# Patient Record
Sex: Male | Born: 1990
Health system: Southern US, Community
[De-identification: ages and names within clinical notes are randomized; demographics above are authoritative.]

## PROBLEM LIST (undated history)

## (undated) DIAGNOSIS — J45909 Unspecified asthma, uncomplicated: Secondary | ICD-10-CM

## (undated) DIAGNOSIS — E785 Hyperlipidemia, unspecified: Secondary | ICD-10-CM

## (undated) DIAGNOSIS — K219 Gastro-esophageal reflux disease without esophagitis: Secondary | ICD-10-CM

## (undated) DIAGNOSIS — F419 Anxiety disorder, unspecified: Secondary | ICD-10-CM

## (undated) HISTORY — DX: Hyperlipidemia, unspecified: E78.5

## (undated) HISTORY — DX: Unspecified asthma, uncomplicated: J45.909

## (undated) HISTORY — PX: INGUINAL HERNIA REPAIR: SUR1180

## (undated) HISTORY — DX: Gastro-esophageal reflux disease without esophagitis: K21.9

## (undated) HISTORY — DX: Anxiety disorder, unspecified: F41.9

---

## 2017-03-17 ENCOUNTER — Ambulatory Visit (HOSPITAL_COMMUNITY)
Admission: EM | Admit: 2017-03-17 | Discharge: 2017-03-17 | Disposition: A | Discharge status: Home / Self Care | Payer: 59 | Attending: Emergency Medicine | Admitting: Emergency Medicine

## 2017-03-17 DIAGNOSIS — H6123 Impacted cerumen, bilateral: Secondary | ICD-10-CM | POA: Diagnosis not present

## 2017-03-17 NOTE — ED Provider Notes (Signed)
  [place]   [id number] 03/17/17 Arrival Time: 4920  ASSESSMENT & PLAN:  1. Bilateral impacted cerumen     No orders of the defined types were placed in this encounter.   Reviewed expectations re: course of current medical issues. Questions answered. Outlined signs and symptoms indicating need for more acute intervention. Patient verbalized understanding. After Visit Summary given.   SUBJECTIVE:  Javonni Macke is a 26 y.o. male who presents with complaint of bilateral cerumen impactions  ROS: As per HPI.   OBJECTIVE:  Vitals:   03/17/17 1202 03/17/17 1203  BP: 140/88   Pulse: (!) 109   Resp: 16   Temp: 98.9 F (37.2 C)   TempSrc: Oral   SpO2: 100%   Weight:  185 lb (83.9 kg)  Height:  6\' 1"  (1.854 m)     General appearance: alert; no distress Eyes: PERRLA; EOMI; conjunctiva normal HENT: normocephalic; atraumatic; TMs occluded with curemen and after irrigation are normal; nasal mucosa normal; oral mucosa normal Neck: supple Lungs: clear to auscultation bilaterally Heart: regular rate and rhythm  No past medical history on file.   has no past medical history on file.  No results found for this or any previous visit.  Labs Reviewed - No data to display  Imaging: No results found.  Allergies not on file  No family history on file. No past surgical history on file.       Lysbeth Penner, [place] 03/17/17 1615

## 2017-03-25 DIAGNOSIS — Z1283 Encounter for screening for malignant neoplasm of skin: Secondary | ICD-10-CM | POA: Diagnosis not present

## 2017-03-25 DIAGNOSIS — L218 Other seborrheic dermatitis: Secondary | ICD-10-CM | POA: Diagnosis not present

## 2017-03-25 DIAGNOSIS — D225 Melanocytic nevi of trunk: Secondary | ICD-10-CM | POA: Diagnosis not present

## 2018-01-05 ENCOUNTER — Encounter: Payer: Self-pay | Admitting: Family Medicine

## 2018-01-05 ENCOUNTER — Ambulatory Visit (INDEPENDENT_AMBULATORY_CARE_PROVIDER_SITE_OTHER): Payer: 59 | Admitting: Family Medicine

## 2018-01-05 VITALS — BP 120/78 | HR 95 | Temp 98.3°F | Ht 73.0 in | Wt 184.0 lb

## 2018-01-05 DIAGNOSIS — Z0000 Encounter for general adult medical examination without abnormal findings: Secondary | ICD-10-CM | POA: Diagnosis not present

## 2018-01-05 DIAGNOSIS — Z13220 Encounter for screening for lipoid disorders: Secondary | ICD-10-CM

## 2018-01-05 HISTORY — DX: Encounter for general adult medical examination without abnormal findings: Z00.00

## 2018-01-05 LAB — CBC
HCT: 42.6 % (ref 39.0–52.0)
HEMOGLOBIN: 15 g/dL (ref 13.0–17.0)
MCHC: 35.3 g/dL (ref 30.0–36.0)
MCV: 93.4 fl (ref 78.0–100.0)
PLATELETS: 209 10*3/uL (ref 150.0–400.0)
RBC: 4.57 Mil/uL (ref 4.22–5.81)
RDW: 13.2 % (ref 11.5–15.5)
WBC: 6.9 10*3/uL (ref 4.0–10.5)

## 2018-01-05 LAB — BASIC METABOLIC PANEL
BUN: 13 mg/dL (ref 6–23)
CHLORIDE: 102 meq/L (ref 96–112)
CO2: 28 mEq/L (ref 19–32)
Calcium: 9.9 mg/dL (ref 8.4–10.5)
Creatinine, Ser: 1.03 mg/dL (ref 0.40–1.50)
GFR: 92.79 mL/min (ref >60.00)
GLUCOSE: 86 mg/dL (ref 70–99)
Potassium: 3.8 mEq/L (ref 3.5–5.1)
Sodium: 139 mEq/L (ref 135–145)

## 2018-01-05 LAB — LIPID PANEL
CHOL/HDL RATIO: 4
Cholesterol: 197 mg/dL (ref 0–200)
HDL: 46.7 mg/dL (ref >39.00)
LDL Cholesterol: 136 mg/dL — ABNORMAL HIGH (ref 0–99)
NONHDL: 150.52
Triglycerides: 75 mg/dL (ref 0.0–149.0)
VLDL: 15 mg/dL (ref 0.0–40.0)

## 2018-01-05 LAB — TSH: TSH: 2.29 u[IU]/mL (ref 0.35–4.50)

## 2018-01-05 NOTE — Patient Instructions (Signed)

## 2018-01-05 NOTE — Assessment & Plan Note (Addendum)
Well adult Orders Placed This Encounter  Procedures  . CBC  . Basic Metabolic Panel (BMET)  . TSH  . Lipid Profile  Immunizations: Up to date Screenings: Lipid panel ordered Anticipatory guidance/Risk Factor Reduction:  See AVS

## 2018-01-05 NOTE — Progress Notes (Signed)
David Best - 27 y.o. male MRN [id number]  Date of birth: [birth year]  Subjective Chief Complaint  Patient presents with  . Establish Care    est care/CPE--fasting    HPI David Best is a 27 y.o. male here today to establish care and for annual wellness exam.  He has history of previous inguinal hernia repair but is otherwise been healthy.  He denies any concerns at this time.  He has a follow fairly healthy diet and is fairly active getting at least 20 to 30 minutes of walking every day.  He is up-to-date on immunizations.  Review of Systems  Constitutional: Negative for chills, fever, malaise/fatigue and weight loss.  HENT: Negative for congestion, ear pain and sore throat.   Eyes: Negative for blurred vision, double vision and pain.  Respiratory: Negative for cough and shortness of breath.   Cardiovascular: Negative for chest pain and palpitations.  Gastrointestinal: Negative for abdominal pain, blood in stool, constipation, heartburn and nausea.  Genitourinary: Negative for dysuria and urgency.  Musculoskeletal: Negative for joint pain and myalgias.  Neurological: Negative for dizziness and headaches.  Endo/Heme/Allergies: Does not bruise/bleed easily.  Psychiatric/Behavioral: Negative for depression. The patient is not nervous/anxious and does not have insomnia.     No Known Allergies  Past Medical History:  Diagnosis Date  . Asthma    as a child    Past Surgical History:  Procedure Laterality Date  . INGUINAL HERNIA REPAIR Left     Social History   Socioeconomic History  . Marital status: Married    Spouse name: Not on file  . Number of children: Not on file  . Years of education: Not on file  . Highest education level: Not on file  Occupational History  . Not on file  Social Needs  . Financial resource strain: Not on file  . Food insecurity:    Worry: Not on file    Inability: Not on file  . Transportation needs:    Medical: Not on file     Non-medical: Not on file  Tobacco Use  . Smoking status: Never Smoker  . Smokeless tobacco: Never Used  Substance and Sexual Activity  . Alcohol use: Yes    Comment: 1-2 beer weekly  . Drug use: Never  . Sexual activity: Not on file  Lifestyle  . Physical activity:    Days per week: Not on file    Minutes per session: Not on file  . Stress: Not on file  Relationships  . Social connections:    Talks on phone: Not on file    Gets together: Not on file    Attends religious service: Not on file    Active member of club or organization: Not on file    Attends meetings of clubs or organizations: Not on file    Relationship status: Not on file  Other Topics Concern  . Not on file  Social History Narrative  . Not on file    Family History  Problem Relation Age of Onset  . Hypertension Father   . Diabetes Paternal Grandmother   . Transient ischemic attack Paternal Grandfather     Health Maintenance  Topic Date Due  . HIV Screening  01/06/2019 (Originally 01/13/2007)  . INFLUENZA VACCINE  02/23/2018  . TETANUS/TDAP  09/24/2027    ----------------------------------------------------------------------------------------------------------------------------------------------------------------------------------------------------------------- Physical Exam BP 120/78   Pulse 95   Temp 98.3 F (36.8 C) (Oral)   Ht 6\' 1"  (1.854 m)  Wt 184 lb (83.5 kg)   SpO2 99%   BMI 24.28 kg/m   Physical Exam  Constitutional: He is oriented to person, place, and time. He appears well-nourished. No distress.  HENT:  Head: Normocephalic and atraumatic.  Mouth/Throat: Oropharynx is clear and moist.  Normal TM bilaterally.   Eyes: No scleral icterus.  Neck: Neck supple. No thyromegaly present.  Cardiovascular: Normal rate, regular rhythm and normal heart sounds.  Pulmonary/Chest: Effort normal and breath sounds normal.  Abdominal: Soft. Bowel sounds are normal. He exhibits no distension.  There is no tenderness.  Musculoskeletal: Normal range of motion. He exhibits no edema.  Lymphadenopathy:    He has no cervical adenopathy.  Neurological: He is alert and oriented to person, place, and time.  Skin: Skin is warm and dry. No rash noted.  Psychiatric: He has a normal mood and affect. His behavior is normal.    ------------------------------------------------------------------------------------------------------------------------------------------------------------------------------------------------------------------- Assessment and Plan  Well adult exam Well adult Orders Placed This Encounter  Procedures  . CBC  . Basic Metabolic Panel (BMET)  . TSH  . Lipid Profile  Immunizations: Up to date Screenings: Lipid panel ordered Anticipatory guidance/Risk Factor Reduction:  See AVS

## 2019-02-27 ENCOUNTER — Other Ambulatory Visit: Payer: Self-pay

## 2019-02-28 ENCOUNTER — Ambulatory Visit (INDEPENDENT_AMBULATORY_CARE_PROVIDER_SITE_OTHER): Payer: No Typology Code available for payment source | Admitting: Family Medicine

## 2019-02-28 ENCOUNTER — Encounter: Payer: Self-pay | Admitting: Family Medicine

## 2019-02-28 VITALS — BP 128/82 | HR 75 | Temp 98.3°F | Ht 73.0 in | Wt 191.6 lb

## 2019-02-28 DIAGNOSIS — Z13220 Encounter for screening for lipoid disorders: Secondary | ICD-10-CM | POA: Diagnosis not present

## 2019-02-28 DIAGNOSIS — Z0000 Encounter for general adult medical examination without abnormal findings: Secondary | ICD-10-CM

## 2019-02-28 LAB — LIPID PANEL
Cholesterol: 196 mg/dL (ref 0–200)
HDL: 46.1 mg/dL (ref >39.00)
LDL Cholesterol: 134 mg/dL — ABNORMAL HIGH (ref 0–99)
NonHDL: 149.45
Total CHOL/HDL Ratio: 4
Triglycerides: 78 mg/dL (ref 0.0–149.0)
VLDL: 15.6 mg/dL (ref 0.0–40.0)

## 2019-02-28 LAB — CBC WITH DIFFERENTIAL/PLATELET
Basophils Absolute: 0 10*3/uL (ref 0.0–0.1)
Basophils Relative: 0.5 % (ref 0.0–3.0)
Eosinophils Absolute: 0 10*3/uL (ref 0.0–0.7)
Eosinophils Relative: 0.8 % (ref 0.0–5.0)
HCT: 44.7 % (ref 39.0–52.0)
Hemoglobin: 15.5 g/dL (ref 13.0–17.0)
Lymphocytes Relative: 32.6 % (ref 12.0–46.0)
Lymphs Abs: 1.8 10*3/uL (ref 0.7–4.0)
MCHC: 34.7 g/dL (ref 30.0–36.0)
MCV: 93.5 fl (ref 78.0–100.0)
Monocytes Absolute: 0.5 10*3/uL (ref 0.1–1.0)
Monocytes Relative: 8.8 % (ref 3.0–12.0)
Neutro Abs: 3.2 10*3/uL (ref 1.4–7.7)
Neutrophils Relative %: 57.3 % (ref 43.0–77.0)
Platelets: 216 10*3/uL (ref 150.0–400.0)
RBC: 4.78 Mil/uL (ref 4.22–5.81)
RDW: 12.9 % (ref 11.5–15.5)
WBC: 5.6 10*3/uL (ref 4.0–10.5)

## 2019-02-28 LAB — COMPREHENSIVE METABOLIC PANEL
ALT: 26 U/L (ref 0–53)
AST: 23 U/L (ref 0–37)
Albumin: 5 g/dL (ref 3.5–5.2)
Alkaline Phosphatase: 39 U/L (ref 39–117)
BUN: 10 mg/dL (ref 6–23)
CO2: 27 mEq/L (ref 19–32)
Calcium: 9.7 mg/dL (ref 8.4–10.5)
Chloride: 102 mEq/L (ref 96–112)
Creatinine, Ser: 0.92 mg/dL (ref 0.40–1.50)
GFR: 98.59 mL/min (ref >60.00)
Glucose, Bld: 93 mg/dL (ref 70–99)
Potassium: 4.1 mEq/L (ref 3.5–5.1)
Sodium: 137 mEq/L (ref 135–145)
Total Bilirubin: 0.7 mg/dL (ref 0.2–1.2)
Total Protein: 7.6 g/dL (ref 6.0–8.3)

## 2019-02-28 LAB — TSH: TSH: 2.1 u[IU]/mL (ref 0.35–4.50)

## 2019-02-28 NOTE — Assessment & Plan Note (Signed)
Well adult Orders Placed This Encounter  Procedures  . Comp Met (CMET)  . CBC w/Diff  . TSH  . Lipid panel  Immunizations:  UTD Screenings:  Lipid panel Anticipatory guidance/Risk factor reduction: encouraged to continue healthy habits, follow diet rich in fruits and vegetables with regular exercise.  Additional recommendations per AVS.

## 2019-02-28 NOTE — Progress Notes (Signed)
David Best - 28 y.o. male MRN [id number]  Date of birth: [birth year]  Subjective Chief Complaint  Patient presents with  . Annual Exam    CPE- fasting    HPI David Best is a 28 y.o. male here today for annual exam.  He denies any changes to his medical history since last year.  He continues to follow a fairly healthy diet.  He remains active.  He is a lifelong nonsmoker.  He consumes 1-2 beers weekly.    Review of Systems  Constitutional: Negative for chills, fever, malaise/fatigue and weight loss.  HENT: Negative for congestion, ear pain and sore throat.   Eyes: Negative for blurred vision, double vision and pain.  Respiratory: Negative for cough and shortness of breath.   Cardiovascular: Negative for chest pain and palpitations.  Gastrointestinal: Negative for abdominal pain, blood in stool, constipation, heartburn and nausea.  Genitourinary: Negative for dysuria and urgency.  Musculoskeletal: Negative for joint pain and myalgias.  Neurological: Negative for dizziness and headaches.  Endo/Heme/Allergies: Does not bruise/bleed easily.  Psychiatric/Behavioral: Negative for depression. The patient is not nervous/anxious and does not have insomnia.     No Known Allergies  Past Medical History:  Diagnosis Date  . Asthma    as a child    Past Surgical History:  Procedure Laterality Date  . INGUINAL HERNIA REPAIR Left     Social History   Socioeconomic History  . Marital status: Married    Spouse name: Not on file  . Number of children: Not on file  . Years of education: Not on file  . Highest education level: Not on file  Occupational History  . Not on file  Social Needs  . Financial resource strain: Not on file  . Food insecurity    Worry: Not on file    Inability: Not on file  . Transportation needs    Medical: Not on file    Non-medical: Not on file  Tobacco Use  . Smoking status: Never Smoker  . Smokeless tobacco: Never Used  Substance  and Sexual Activity  . Alcohol use: Yes    Comment: 1-2 beer weekly  . Drug use: Never  . Sexual activity: Not on file  Lifestyle  . Physical activity    Days per week: Not on file    Minutes per session: Not on file  . Stress: Not on file  Relationships  . Social Herbalist on phone: Not on file    Gets together: Not on file    Attends religious service: Not on file    Active member of club or organization: Not on file    Attends meetings of clubs or organizations: Not on file    Relationship status: Not on file  Other Topics Concern  . Not on file  Social History Narrative  . Not on file    Family History  Problem Relation Age of Onset  . Hypertension Father   . Diabetes Paternal Grandmother   . Transient ischemic attack Paternal Grandfather     Health Maintenance  Topic Date Due  . HIV Screening  01/13/2007  . INFLUENZA VACCINE  02/24/2019  . TETANUS/TDAP  09/24/2027    ----------------------------------------------------------------------------------------------------------------------------------------------------------------------------------------------------------------- Physical Exam BP 128/82   Pulse 75   Temp 98.3 F (36.8 C) (Oral)   Ht 6' 1"  (1.854 m)   Wt 191 lb 9.6 oz (86.9 kg)   SpO2 99%   BMI 25.28 kg/m   Physical  Exam Constitutional:      General: He is not in acute distress. HENT:     Head: Normocephalic and atraumatic.     Right Ear: External ear normal.     Left Ear: External ear normal.  Eyes:     General: No scleral icterus. Neck:     Musculoskeletal: Normal range of motion.     Thyroid: No thyromegaly.  Cardiovascular:     Rate and Rhythm: Normal rate and regular rhythm.     Heart sounds: Normal heart sounds.  Pulmonary:     Effort: Pulmonary effort is normal.     Breath sounds: Normal breath sounds.  Abdominal:     General: Bowel sounds are normal. There is no distension.     Palpations: Abdomen is soft.      Tenderness: There is no abdominal tenderness. There is no guarding.  Lymphadenopathy:     Cervical: No cervical adenopathy.  Skin:    General: Skin is warm and dry.     Findings: No rash.  Neurological:     Mental Status: He is alert and oriented to person, place, and time.     Cranial Nerves: No cranial nerve deficit.     Motor: No abnormal muscle tone.  Psychiatric:        Behavior: Behavior normal.     ------------------------------------------------------------------------------------------------------------------------------------------------------------------------------------------------------------------- Assessment and Plan  Well adult exam Well adult Orders Placed This Encounter  Procedures  . Comp Met (CMET)  . CBC w/Diff  . TSH  . Lipid panel  Immunizations:  UTD Screenings:  Lipid panel Anticipatory guidance/Risk factor reduction: encouraged to continue healthy habits, follow diet rich in fruits and vegetables with regular exercise.  Additional recommendations per AVS.

## 2019-02-28 NOTE — Patient Instructions (Signed)
Preventive Care 19-27 Years Old, Male Preventive care refers to lifestyle choices and visits with your health care provider that can promote health and wellness. This includes:  A yearly physical exam. This is also called an annual well check.  Regular dental and eye exams.  Immunizations.  Screening for certain conditions.  Healthy lifestyle choices, such as eating a healthy diet, getting regular exercise, not using drugs or products that contain nicotine and tobacco, and limiting alcohol use. What can I expect for my preventive care visit? Physical exam Your health care provider will check:  Height and weight. These may be used to calculate body mass index (BMI), which is a measurement that tells if you are at a healthy weight.  Heart rate and blood pressure.  Your skin for abnormal spots. Counseling Your health care provider may ask you questions about:  Alcohol, tobacco, and drug use.  Emotional well-being.  Home and relationship well-being.  Sexual activity.  Eating habits.  Work and work Statistician. What immunizations do I need?  Influenza (flu) vaccine  This is recommended every year. Tetanus, diphtheria, and pertussis (Tdap) vaccine  You may need a Td booster every 10 years. Varicella (chickenpox) vaccine  You may need this vaccine if you have not already been vaccinated. Human papillomavirus (HPV) vaccine  If recommended by your health care provider, you may need three doses over 6 months. Measles, mumps, and rubella (MMR) vaccine  You may need at least one dose of MMR. You may also need a second dose. Meningococcal conjugate (MenACWY) vaccine  One dose is recommended if you are 45-76 years old and a Market researcher living in a residence hall, or if you have one of several medical conditions. You may also need additional booster doses. Pneumococcal conjugate (PCV13) vaccine  You may need this if you have certain conditions and were not  previously vaccinated. Pneumococcal polysaccharide (PPSV23) vaccine  You may need one or two doses if you smoke cigarettes or if you have certain conditions. Hepatitis A vaccine  You may need this if you have certain conditions or if you travel or work in places where you may be exposed to hepatitis A. Hepatitis B vaccine  You may need this if you have certain conditions or if you travel or work in places where you may be exposed to hepatitis B. Haemophilus influenzae type b (Hib) vaccine  You may need this if you have certain risk factors. You may receive vaccines as individual doses or as more than one vaccine together in one shot (combination vaccines). Talk with your health care provider about the risks and benefits of combination vaccines. What tests do I need? Blood tests  Lipid and cholesterol levels. These may be checked every 5 years starting at age 17.  Hepatitis C test.  Hepatitis B test. Screening   Diabetes screening. This is done by checking your blood sugar (glucose) after you have not eaten for a while (fasting).  Sexually transmitted disease (STD) testing. Talk with your health care provider about your test results, treatment options, and if necessary, the need for more tests. Follow these instructions at home: Eating and drinking   Eat a diet that includes fresh fruits and vegetables, whole grains, lean protein, and low-fat dairy products.  Take vitamin and mineral supplements as recommended by your health care provider.  Do not drink alcohol if your health care provider tells you not to drink.  If you drink alcohol: ? Limit how much you have to 0-2  drinks a day. ? Be aware of how much alcohol is in your drink. In the [place] one drink equals one 12 oz bottle of beer (355 mL), one 5 oz glass of wine (148 mL), or one 1 oz glass of hard liquor (44 mL). Lifestyle  Take daily care of your teeth and gums.  Stay active. Exercise for at least 30 minutes on 5 or  more days each week.  Do not use any products that contain nicotine or tobacco, such as cigarettes, e-cigarettes, and chewing tobacco. If you need help quitting, ask your health care provider.  If you are sexually active, practice safe sex. Use a condom or other form of protection to prevent STIs (sexually transmitted infections). What's next?  Go to your health care provider once a year for a well check visit.  Ask your health care provider how often you should have your eyes and teeth checked.  Stay up to date on all vaccines. This information is not intended to replace advice given to you by your health care provider. Make sure you discuss any questions you have with your health care provider. Document Released: 09/07/2001 Document Revised: 07/06/2018 Document Reviewed: 07/06/2018 Elsevier Patient Education  2020 Elsevier Inc.  

## 2019-05-30 ENCOUNTER — Encounter: Payer: Self-pay | Admitting: Physician Assistant

## 2019-05-30 ENCOUNTER — Ambulatory Visit
Admission: EM | Admit: 2019-05-30 | Discharge: 2019-05-30 | Disposition: A | Discharge status: Home / Self Care | Payer: No Typology Code available for payment source

## 2019-05-30 DIAGNOSIS — H938X1 Other specified disorders of right ear: Secondary | ICD-10-CM

## 2019-05-30 NOTE — ED Provider Notes (Signed)
[facility]    CSN: [id number] Arrival date & time: 05/30/19  1230      History   Chief Complaint Chief Complaint  Patient presents with  . Otalgia    HPI David Best is a 28 y.o. male.   28 year old male comes in for 2 day history of right ear irritation. Patient had muffled hearing 2 days ago, had RN at work check ear and was found to be impacted with cerumen. After ear irritation, muffled hearing resolved, but has since then had ear irritation, aching, and feelings of fluid in the ear. Denies ear drainage. Denies fever, chills, body aches. Denies URI symptoms such as cough, congestion, sore throat. Has not tried anything else for the symptoms.      Past Medical History:  Diagnosis Date  . Asthma    as a child    Patient Active Problem List   Diagnosis Date Noted  . Well adult exam 01/05/2018    Past Surgical History:  Procedure Laterality Date  . INGUINAL HERNIA REPAIR Left        Home Medications    Prior to Admission medications   Not on File    Family History Family History  Problem Relation Age of Onset  . Hypertension Father   . Diabetes Paternal Grandmother   . Transient ischemic attack Paternal Grandfather     Social History Social History   Tobacco Use  . Smoking status: Never Smoker  . Smokeless tobacco: Never Used  Substance Use Topics  . Alcohol use: Yes    Comment: 1-2 beer weekly  . Drug use: Never     Allergies   Patient has no known allergies.   Review of Systems Review of Systems  Reason unable to perform ROS: See HPI as above.     Physical Exam Triage Vital Signs ED Triage Vitals [05/30/19 1240]  Enc Vitals Group     BP 129/86     Pulse Rate 68     Resp 16     Temp 97.9 F (36.6 C)     Temp Source Oral     SpO2 98 %     Weight      Height      Head Circumference      Peak Flow      Pain Score      Pain Loc      Pain Edu?      Excl. in [place]?    No data found.  Updated Vital  Signs BP 129/86 (BP Location: Right Arm)   Pulse 68   Temp 97.9 F (36.6 C) (Oral)   Resp 16   SpO2 98%   Physical Exam Constitutional:      General: He is not in acute distress.    Appearance: He is well-developed. He is not diaphoretic.  HENT:     Head: Normocephalic and atraumatic.     Right Ear: Tympanic membrane, ear canal and external ear normal. Tympanic membrane is not erythematous or bulging.     Left Ear: Ear canal and external ear normal. A middle ear effusion is present. Tympanic membrane is not erythematous or bulging.     Ears:     Comments: No tenderness to palpation of bilateral tragus.  Eyes:     Conjunctiva/sclera: Conjunctivae normal.     Pupils: Pupils are equal, round, and reactive to light.  Pulmonary:     Effort: Pulmonary effort is normal. No respiratory distress.  Neurological:     Mental Status: He is alert and oriented to person, place, and time.      UC Treatments / Results  Labs (all labs ordered are listed, but only abnormal results are displayed) Labs Reviewed - No data to display  EKG   Radiology No results found.  Procedures Procedures (including critical care time)  Medications Ordered in UC Medications - No data to display  Initial Impression / Assessment and Plan / UC Course  I have reviewed the triage vital signs and the nursing notes.  Pertinent labs & imaging results that were available during my care of the patient were reviewed by me and considered in my medical decision making (see chart for details).    Normal exam to the right ear. Left ear with mid ear effusion, ?eustachian tube dysfunction. No signs of otitis media/externa. Discussed using flonase and continue to monitor symptoms. Return precautions given. Patient expresses understanding and agrees to plan.  Final Clinical Impressions(s) / UC Diagnoses   Final diagnoses:  Irritation of right ear   ED Prescriptions    None     PDMP not reviewed this encounter.    Ok Edwards, PA-C 05/30/19 1443

## 2019-05-30 NOTE — ED Triage Notes (Signed)
Pt c/o rt ear pain x3 days, flushed x1 day with relief, still feels like residual fluid in ear

## 2019-05-30 NOTE — Discharge Instructions (Signed)
Normal exam. Start flonase for possible eustachian tube dysfunction. Recheck if symptoms not improving.

## 2019-06-05 ENCOUNTER — Other Ambulatory Visit: Payer: Self-pay | Admitting: Family Medicine

## 2019-06-05 ENCOUNTER — Encounter: Payer: Self-pay | Admitting: Family Medicine

## 2019-06-05 ENCOUNTER — Telehealth: Payer: Self-pay | Admitting: *Deleted

## 2019-06-05 ENCOUNTER — Other Ambulatory Visit: Payer: Self-pay

## 2019-06-05 ENCOUNTER — Ambulatory Visit (INDEPENDENT_AMBULATORY_CARE_PROVIDER_SITE_OTHER): Payer: No Typology Code available for payment source | Admitting: Family Medicine

## 2019-06-05 ENCOUNTER — Telehealth: Payer: Self-pay

## 2019-06-05 ENCOUNTER — Ambulatory Visit: Payer: Self-pay | Admitting: *Deleted

## 2019-06-05 ENCOUNTER — Ambulatory Visit (INDEPENDENT_AMBULATORY_CARE_PROVIDER_SITE_OTHER): Payer: No Typology Code available for payment source

## 2019-06-05 VITALS — BP 130/90 | HR 69 | Temp 98.6°F | Ht 73.0 in | Wt 187.7 lb

## 2019-06-05 DIAGNOSIS — R0789 Other chest pain: Secondary | ICD-10-CM | POA: Insufficient documentation

## 2019-06-05 DIAGNOSIS — R9389 Abnormal findings on diagnostic imaging of other specified body structures: Secondary | ICD-10-CM

## 2019-06-05 LAB — D-DIMER, QUANTITATIVE (NOT AT ARMC): D-Dimer, Quant: <0.19 mcg/mL FEU (ref <0.50)

## 2019-06-05 NOTE — Telephone Encounter (Signed)
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 Call to office for appointment.  Reason for Disposition . 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 Call to office for appointment  Answer Assessment - Initial Assessment Questions 1. LOCATION: "Where does it hurt?"       Tightness- in middle to off left then at other times feels all over. 2. RADIATION: "Does the pain go anywhere else?" (e.g., into neck, jaw, arms, back)     no 3. ONSET: "When did the chest pain begin?" (Minutes, hours or days)      On/off over the weekend 4. PATTERN "Does the pain come and go, or has it been constant since it started?"  "Does it get worse with exertion?"      consistent other times it is more pronounced 5. DURATION: "How long does it last" (e.g., seconds, minutes, hours)     Tightness- not pressure or pain 6. SEVERITY: "How bad is the pain?"  (e.g., Scale 1-10; mild, moderate, or severe)    - MILD (1-3): doesn't interfere with normal activities     - MODERATE (4-7): interferes with normal activities or awakens from sleep    - SEVERE (8-10): excruciating pain, unable to do any normal activities       mild 7. CARDIAC RISK FACTORS: "Do you have any history of heart problems or risk factors for heart disease?" (e.g., angina, prior heart attack; diabetes, high blood pressure, high cholesterol, smoker, or strong family history of heart disease)     No- family history Afib 8. PULMONARY RISK FACTORS: "Do you have any history of lung disease?"  (e.g., blood clots in lung, asthma, emphysema, birth control pills)     childhood asthma only 9. CAUSE: "What do you think is causing the chest pain?"     Patient thinks it more a breathing issue 10. OTHER SYMPTOMS: "Do you have any other symptoms?"  (e.g., dizziness, nausea, vomiting, sweating, fever, difficulty breathing, cough)       Off/on cough- allergy related 11. PREGNANCY: "Is there any chance you are pregnant?" "When was your last menstrual period?"       n/a  Protocols used: CHEST PAIN-A-AH

## 2019-06-05 NOTE — Telephone Encounter (Signed)
Pt returned call and message given to him regarding the results of D-dimer and CXR. He would like to have the chest xray repeated. And would like someone to give him a call back regarding when and where he should go for the xray.  He is feeling a little better regarding his chest pain.

## 2019-06-05 NOTE — Telephone Encounter (Signed)
appt sch 06/06/19 @ 1:00pm.

## 2019-06-05 NOTE — Patient Instructions (Signed)
We'll be in touch with results from D-Dimer and chest xray.  If both are negative I think we can try conservative treatment with anti-inflammatory and alternating heat/ice to area.

## 2019-06-05 NOTE — Telephone Encounter (Signed)
Questions for Screening COVID-19    Travel or Contacts: no  During this illness, did/does the patient experience any of the following symptoms? Fever >100.66F []   Yes [x]   No []   Unknown Subjective fever (felt feverish) []   Yes [x]   No []   Unknown Chills []   Yes [x]   No []   Unknown Muscle aches (myalgia) []   Yes [x]   No []   Unknown Runny nose (rhinorrhea) []   Yes [x]   No []   Unknown Sore throat []   Yes [x]   No []   Unknown Cough (new onset or worsening of chronic cough) []   Yes [x]   No []   Unknown Shortness of breath (dyspnea) []   Yes [x]   No []   Unknown Nausea or vomiting []   Yes [x]   No []   Unknown Headache []   Yes [x]   No []   Unknown Abdominal pain  []   Yes [x]   No []   Unknown Diarrhea (?3 loose/looser than normal stools/24hr period) []   Yes [x]   No []   Unknown

## 2019-06-05 NOTE — Telephone Encounter (Signed)
Orders entered for repeat cxr.  I would recommend that he have this completed here and I'll talk to Levada Dy to be sure we get the view that radiology requested.  Please schedule at a time that is convenient for him.

## 2019-06-05 NOTE — Assessment & Plan Note (Addendum)
-  Had EKG completed already and normal.  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 -CXR completed today.  By my read there is no acute process noted.  -Check stat D-dimer

## 2019-06-05 NOTE — Progress Notes (Signed)
David Best - 28 y.o. male MRN [id number]  Date of birth: May 27, 1991  Subjective Chief Complaint  Patient presents with  . Chest Pain    chest tightness on and off over the weekend worse today.    HPI David Best is a 28 y.o. male here today with complaint of chest pain/tightness.  He reports that he has had this off and on over the weekend with another episode this morning.  Chest pain is just left of sternum with some radiation into back.  He denies radiation into jaw or arm.  Pain is not worsened with exertion.  He doesn't really feel shortness of breath.  He denies nausea, vomiting or heartburn.  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 He is concerned because he has had a couple of friends who had asymptomatic COVID infections and had similar symptoms that turned out to be a PE.    ROS:  A comprehensive ROS was completed and negative except as noted per HPI  No Known Allergies  Past Medical History:  Diagnosis Date  . Asthma    as a child    Past Surgical History:  Procedure Laterality Date  . INGUINAL HERNIA REPAIR Left     Social History   Socioeconomic History  . Marital status: Married    Spouse name: Not on file  . Number of children: Not on file  . Years of education: Not on file  . Highest education level: Not on file  Occupational History  . Not on file  Social Needs  . Financial resource strain: Not on file  . Food insecurity    Worry: Not on file    Inability: Not on file  . Transportation needs    Medical: Not on file    Non-medical: Not on file  Tobacco Use  . Smoking status: Never Smoker  . Smokeless tobacco: Never Used  Substance and Sexual Activity  . Alcohol use: Yes    Comment: 1-2 beer weekly  . Drug use: Never  . Sexual activity: Not on file  Lifestyle  . Physical activity    Days per week: Not on file    Minutes per session: Not on  file  . Stress: Not on file  Relationships  . Social Herbalist on phone: Not on file    Gets together: Not on file    Attends religious service: Not on file    Active member of club or organization: Not on file    Attends meetings of clubs or organizations: Not on file    Relationship status: Not on file  Other Topics Concern  . Not on file  Social History Narrative  . Not on file    Family History  Problem Relation Age of Onset  . Hypertension Father   . Diabetes Paternal Grandmother   . Transient ischemic attack Paternal Grandfather     Health Maintenance  Topic Date Due  . HIV Screening  01/13/2007  . TETANUS/TDAP  09/24/2027  . INFLUENZA VACCINE  Completed    ----------------------------------------------------------------------------------------------------------------------------------------------------------------------------------------------------------------- Physical Exam BP 130/90   Pulse 69   Temp 98.6 F (37 C) (Temporal)   Ht 6\' 1"  (1.854 m)   Wt 187 lb 11.2 oz (85.1 kg)   SpO2 99%   BMI 24.76 kg/m   Physical Exam Constitutional:      Appearance: He is well-developed.  HENT:     Head: Normocephalic and atraumatic.     Mouth/Throat:     Mouth: Mucous membranes are moist.  Eyes:     General: No scleral icterus. Cardiovascular:     Rate and Rhythm: Normal rate and regular rhythm.     Heart sounds: Normal heart sounds. No murmur. No friction rub.  Pulmonary:     Effort: Pulmonary effort is normal. No respiratory distress.     Breath sounds: Normal breath sounds. No wheezing.  Chest:     Chest wall: No tenderness.  Musculoskeletal:     Right lower leg: No edema.     Left lower leg: No edema.  Skin:    General: Skin is warm and dry.     Findings: No rash.  Neurological:     Mental Status: He is alert.  Psychiatric:        Mood and Affect: Mood normal.        Behavior: Behavior normal.      ------------------------------------------------------------------------------------------------------------------------------------------------------------------------------------------------------------------- Assessment and Plan  Chest tightness -Had EKG completed already and normal.  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 -CXR completed today.  By my read there is no acute process noted.  -Check stat D-dimer

## 2019-06-06 ENCOUNTER — Ambulatory Visit (INDEPENDENT_AMBULATORY_CARE_PROVIDER_SITE_OTHER): Payer: No Typology Code available for payment source

## 2019-06-06 ENCOUNTER — Other Ambulatory Visit: Payer: Self-pay

## 2019-06-06 ENCOUNTER — Encounter: Payer: Self-pay | Admitting: Family Medicine

## 2019-06-06 DIAGNOSIS — R9389 Abnormal findings on diagnostic imaging of other specified body structures: Secondary | ICD-10-CM | POA: Diagnosis not present

## 2019-06-06 NOTE — Addendum Note (Signed)
Addended by: Lynnea Ferrier on: 06/06/2019 01:13 PM   Modules accepted: Orders

## 2019-09-06 ENCOUNTER — Telehealth (INDEPENDENT_AMBULATORY_CARE_PROVIDER_SITE_OTHER): Payer: No Typology Code available for payment source | Admitting: Family Medicine

## 2019-09-06 ENCOUNTER — Other Ambulatory Visit: Payer: Self-pay

## 2019-09-06 ENCOUNTER — Encounter: Payer: Self-pay | Admitting: Family Medicine

## 2019-09-06 VITALS — Temp 98.4°F | Ht 73.0 in | Wt 189.8 lb

## 2019-09-06 DIAGNOSIS — Z8709 Personal history of other diseases of the respiratory system: Secondary | ICD-10-CM | POA: Diagnosis not present

## 2019-09-06 DIAGNOSIS — R0789 Other chest pain: Secondary | ICD-10-CM | POA: Diagnosis not present

## 2019-09-06 MED ORDER — FLOVENT HFA 110 MCG/ACT IN AERO: 1.0000 | INHALATION_SPRAY | Freq: Two times a day (BID) | RESPIRATORY_TRACT | 1 refills | Status: DC

## 2019-09-06 MED FILL — FLOVENT HFA 110 MCG INHALER: 110 | 30 days supply | Qty: 12 | Fill #0

## 2019-09-06 NOTE — Progress Notes (Signed)
Virtual Visit via Video Note  I connected with David Best on 09/06/19 at  1:00 PM EST by a video enabled telemedicine application and verified that I am speaking with the correct person using two identifiers. Location patient: home Location provider:  home office Persons participating in the virtual visit: patient, provider  I discussed the limitations of evaluation and management by telemedicine and the availability of in person appointments. The patient expressed understanding and agreed to proceed.  Chief Complaint  Patient presents with  . Chest Pain    Pt c/o feeling pressure and tightness in his chest, on and off since Nov.       HPI: David Best is a 29 y.o. male who was previously a patient of Dr. Zigmund Daniel who complains of intermittent chest tightness/pressure x 3 mo. Tightness located in center of chest or to left of center. He saw PCP in 05/2019 for this, has CXR x 2 done - normal. EKG done at work - normal as per pt. He also endorses intermittent, dry cough.  No CP, palpitations, n/v/d/c. No runny nose, nasal congestion, PND.  Pt states he has good and bad days/weeks.  Pt did not have covid test done.   He started running for exercise and improved diet to help lower cholesterol. Pt is not a smoker. He has a h/o asthma as a child but is not currently on any medication for this.  Past Medical History:  Diagnosis Date  . Asthma    as a child    Past Surgical History:  Procedure Laterality Date  . INGUINAL HERNIA REPAIR Left     Family History  Problem Relation Age of Onset  . Hypertension Father   . Diabetes Paternal Grandmother   . Transient ischemic attack Paternal Grandfather     Social History   Tobacco Use  . Smoking status: Never Smoker  . Smokeless tobacco: Never Used  Substance Use Topics  . Alcohol use: Yes    Comment: 1-2 beer weekly  . Drug use: Never    No current outpatient medications on file.  No Known  Allergies    ROS: See pertinent positives and negatives per HPI.   EXAM:  VITALS per patient if applicable: Temp 99991111 F (36.9 C) (Oral)   Ht 6\' 1"  (1.854 m)   Wt 189 lb 12.8 oz (86.1 kg)   BMI 25.04 kg/m   BP Readings from Last 3 Encounters:  06/05/19 130/90  05/30/19 129/86  02/28/19 128/82   Pulse Readings from Last 3 Encounters:  06/05/19 69  05/30/19 68  02/28/19 75   Wt Readings from Last 3 Encounters:  09/06/19 189 lb 12.8 oz (86.1 kg)  06/05/19 187 lb 11.2 oz (85.1 kg)  02/28/19 191 lb 9.6 oz (86.9 kg)   GENERAL: alert, oriented, appears well and in no acute distress  HEENT: atraumatic, conjunctiva clear, no obvious abnormalities on inspection of external nose and ears  NECK: normal movements of the head and neck  LUNGS: on inspection no signs of respiratory distress, breathing rate appears normal, no obvious gross SOB, gasping or wheezing, no conversational dyspnea  CV: no obvious cyanosis  PSYCH/NEURO: pleasant and cooperative, speech and thought processing grossly intact   ASSESSMENT AND PLAN: 1. Chest tightness 2. H/O intrinsic asthma - normal CXR, EKG, cardiopulmonary exam - symptoms x 3 mo, improved but still present - pt has h/o childhood asthma so could be asthma/reactive airway in nature. Could also have anxiety as contributing factor  Rx: - fluticasone (FLOVENT HFA) 110 MCG/ACT inhaler; Inhale 1 puff into the lungs 2 (two) times daily.  Dispense: 1 Inhaler; Refill: 1 - f/u in 3-4 wks if no or minimal improvement or sooner PRN  Discussed plan and reviewed medications with patient, including risks, benefits, and potential side effects. Pt expressed understand. All questions answered.     I discussed the assessment and treatment plan with the patient. The patient was provided an opportunity to ask questions and all were answered. The patient agreed with the plan and demonstrated an understanding of the instructions.   The patient was advised  to call back or seek an in-person evaluation if the symptoms worsen or if the condition fails to improve as anticipated.   Letta Median, DO

## 2019-09-07 ENCOUNTER — Encounter: Payer: Self-pay | Admitting: Family Medicine

## 2019-10-16 ENCOUNTER — Encounter: Payer: Self-pay | Admitting: Family Medicine

## 2019-10-22 ENCOUNTER — Other Ambulatory Visit: Payer: Self-pay

## 2019-10-22 NOTE — Patient Instructions (Signed)
Health Maintenance Due  Topic Date Due  . HIV Screening  Never done    Depression screen [facility] 2/9 02/28/2019 01/05/2018  Decreased Interest 0 0  Down, Depressed, Hopeless 0 -  PHQ - 2 Score 0 0

## 2019-10-23 ENCOUNTER — Encounter: Payer: Self-pay | Admitting: Family Medicine

## 2019-10-23 ENCOUNTER — Ambulatory Visit (INDEPENDENT_AMBULATORY_CARE_PROVIDER_SITE_OTHER): Payer: No Typology Code available for payment source | Admitting: Family Medicine

## 2019-10-23 VITALS — BP 120/70 | HR 83 | Temp 98.6°F | Ht 73.0 in | Wt 192.2 lb

## 2019-10-23 DIAGNOSIS — R5383 Other fatigue: Secondary | ICD-10-CM | POA: Diagnosis not present

## 2019-10-23 DIAGNOSIS — Z8709 Personal history of other diseases of the respiratory system: Secondary | ICD-10-CM | POA: Diagnosis not present

## 2019-10-23 DIAGNOSIS — R0789 Other chest pain: Secondary | ICD-10-CM

## 2019-10-23 LAB — CBC
HCT: 45 % (ref 39.0–52.0)
Hemoglobin: 15.7 g/dL (ref 13.0–17.0)
MCHC: 34.9 g/dL (ref 30.0–36.0)
MCV: 94 fl (ref 78.0–100.0)
Platelets: 220 10*3/uL (ref 150.0–400.0)
RBC: 4.79 Mil/uL (ref 4.22–5.81)
RDW: 12.9 % (ref 11.5–15.5)
WBC: 7.5 10*3/uL (ref 4.0–10.5)

## 2019-10-23 LAB — VITAMIN D 25 HYDROXY (VIT D DEFICIENCY, FRACTURES): VITD: 22.33 ng/mL — ABNORMAL LOW (ref 30.00–100.00)

## 2019-10-23 LAB — TSH: TSH: 3.44 u[IU]/mL (ref 0.35–4.50)

## 2019-10-23 LAB — VITAMIN B12: Vitamin B-12: 232 pg/mL (ref 211–911)

## 2019-10-23 NOTE — Progress Notes (Signed)
David Best is a 29 y.o. male  Chief Complaint  Patient presents with  . Follow-up    Medication discussion.  Pt c/o feeling fatigue x 46month    HPI: David Best is a 29 y.o. male here for f/u on chest tightness and cough. I saw him virtually on 09/06/19 and started pt on flovent. Since that time, pt reports the cough has significantly improved. He feels the chest tightness has improved.  He reports new onset fatigue, weakness, some lightheadedness which first started in 03/2019 and lasted about 1 mo then resolved. Pt started running again for exercise in 03/2019. Pt stopped running and after a month symptoms improved. Symptoms returned in the past 1 mo.   Pt still coughs in the AM when he wakes up and has to clear his throat. No new or worse stuffy nose, runny nose.   Pt does snore, this is not new or different. No witnessed apneic episodes. Pt feels he sleeps well and is rested when he wakes up. Asleep 9-9:30am, wakes 5am.  Denies depression. Pt endorses anxiety that is baseline/onrmal for him.   Pt does have GERD, but was worse as a child. No new or increased symptoms.   Past Medical History:  Diagnosis Date  . Asthma    as a child    Past Surgical History:  Procedure Laterality Date  . INGUINAL HERNIA REPAIR Left     Social History   Socioeconomic History  . Marital status: Married    Spouse name: Not on file  . Number of children: Not on file  . Years of education: Not on file  . Highest education level: Not on file  Occupational History  . Not on file  Tobacco Use  . Smoking status: Never Smoker  . Smokeless tobacco: Never Used  Substance and Sexual Activity  . Alcohol use: Yes    Comment: 1-2 beer weekly  . Drug use: Never  . Sexual activity: Not on file  Other Topics Concern  . Not on file  Social History Narrative  . Not on file   Social Determinants of Health   Financial Resource Strain:   . Difficulty of Paying Living Expenses:     Food Insecurity:   . Worried About Charity fundraiser in the Last Year:   . Arboriculturist in the Last Year:   Transportation Needs:   . Film/video editor (Medical):   Marland Kitchen Lack of Transportation (Non-Medical):   Physical Activity:   . Days of Exercise per Week:   . Minutes of Exercise per Session:   Stress:   . Feeling of Stress :   Social Connections:   . Frequency of Communication with Friends and Family:   . Frequency of Social Gatherings with Friends and Family:   . Attends Religious Services:   . Active Member of Clubs or Organizations:   . Attends Archivist Meetings:   Marland Kitchen Marital Status:   Intimate Partner Violence:   . Fear of Current or Ex-Partner:   . Emotionally Abused:   Marland Kitchen Physically Abused:   . Sexually Abused:     Family History  Problem Relation Age of Onset  . Hypertension Father   . Diabetes Paternal Grandmother   . Transient ischemic attack Paternal Grandfather       There is no immunization history on file for this patient.  Outpatient Encounter Medications as of 10/23/2019  Medication Sig  . fluticasone (FLOVENT HFA)  110 MCG/ACT inhaler Inhale 1 puff into the lungs 2 (two) times daily.   No facility-administered encounter medications on file as of 10/23/2019.     ROS: Pertinent positives and negatives noted in HPI. Remainder of ROS non-contributory  No Known Allergies  BP 120/70 (BP Location: Left Arm, Patient Position: Sitting, Cuff Size: Normal)   Pulse 83   Temp 98.6 F (37 C) (Temporal)   Ht 6\' 1"  (1.854 m)   Wt 192 lb 3.2 oz (87.2 kg)   SpO2 98%   BMI 25.36 kg/m   Physical Exam  Constitutional: He is oriented to person, place, and time. He appears well-developed and well-nourished. No distress.  Cardiovascular: Normal rate, regular rhythm and normal heart sounds.  No murmur heard. Pulmonary/Chest: Effort normal and breath sounds normal. No respiratory distress. He has no wheezes.  Musculoskeletal:        General:  No edema.  Neurological: He is alert and oriented to person, place, and time.  Skin: Skin is warm and dry.  Psychiatric: He has a normal mood and affect. His behavior is normal.     A/P:  1. Chest tightness 2. H/O intrinsic asthma - SAR CoV2 Serology (COVID 19)AB(IGG)IA - pt had fatigue, lightheadedness, chest tightness, cough in 03/2019 but was never tested for COVID - cont with flovent - symptoms much-improved but not fully resolved  - pt also seems to have some seasonal allergy/allergic rhinitis symptoms. Could consider addition of singulair if symptoms do not continue to improve - GERD less likely in my opinion  3. Fatigue, unspecified type - Vitamin B12 - CBC - Iron, TIBC and Ferritin Panel - VITAMIN D 25 Hydroxy (Vit-D Deficiency, Fractures) - TSH - SAR CoV2 Serology (COVID 19)AB(IGG)IA - cont with adequate hydration, regular meals, adequate sleep - could be d/t stress/anxiety, allergies, other - will contact pt when results available  This visit occurred during the SARS-CoV-2 public health emergency.  Safety protocols were in place, including screening questions prior to the visit, additional usage of staff PPE, and extensive cleaning of exam room while observing appropriate contact time as indicated for disinfecting solutions.

## 2019-10-23 NOTE — Addendum Note (Signed)
Addended by: Lynnea Ferrier on: 10/23/2019 02:01 PM   Modules accepted: Orders

## 2019-10-24 ENCOUNTER — Telehealth: Payer: Self-pay | Admitting: Family Medicine

## 2019-10-24 ENCOUNTER — Encounter: Payer: Self-pay | Admitting: Family Medicine

## 2019-10-24 LAB — IRON,TIBC AND FERRITIN PANEL
%SAT: 44 % (calc) (ref 20–48)
Ferritin: 137 ng/mL (ref 38–380)
Iron: 132 ug/dL (ref 50–195)
TIBC: 299 mcg/dL (calc) (ref 250–425)

## 2019-10-24 LAB — SARS-COV-2 ANTIBODY(IGG)SPIKE,SEMI-QUANTITATIVE: SARS COV1 AB(IGG)SPIKE,SEMI QN: 17.12 index — ABNORMAL HIGH (ref <1.00)

## 2019-10-24 NOTE — Telephone Encounter (Signed)
Pt will start B12 injections. 1038mcg IM weekly x 4 wks then monthly x 4 mo.  Liane Comber, please call pt to schedule nurse appt for this

## 2019-10-25 NOTE — Telephone Encounter (Signed)
I scheduled for his first 4 weeks and he will schedule the start of his monthly at his last weekly b12

## 2019-10-29 ENCOUNTER — Other Ambulatory Visit: Payer: Self-pay

## 2019-10-30 ENCOUNTER — Ambulatory Visit (INDEPENDENT_AMBULATORY_CARE_PROVIDER_SITE_OTHER): Payer: No Typology Code available for payment source

## 2019-10-30 DIAGNOSIS — E538 Deficiency of other specified B group vitamins: Secondary | ICD-10-CM

## 2019-10-30 MED ORDER — CYANOCOBALAMIN 1000 MCG/ML IJ SOLN
1000.0000 ug | Freq: Once | INTRAMUSCULAR | Status: AC
  Administered 2019-10-30: 1000 ug via INTRAMUSCULAR

## 2019-10-30 NOTE — Patient Instructions (Signed)
Health Maintenance Due  Topic Date Due  . HIV Screening  Never done    Depression screen [facility] 2/9 02/28/2019 01/05/2018  Decreased Interest 0 0  Down, Depressed, Hopeless 0 -  PHQ - 2 Score 0 0

## 2019-10-30 NOTE — Progress Notes (Signed)
Medical screening examination/treatment/procedure(s) were performed by the CMA. As primary care provider I was immediately available for consulation/collaboration. I agree with above documentation. Aasiyah Auerbach, AGNP-C 

## 2019-10-30 NOTE — Progress Notes (Signed)
.  Per orders of Dr. Bryan Lemma  injection of B12 given by Verline Lema L Kaymon Denomme in right arm. Patient tolerated injection well. Patient will make appointment for 1 week.

## 2019-11-06 ENCOUNTER — Ambulatory Visit (INDEPENDENT_AMBULATORY_CARE_PROVIDER_SITE_OTHER): Payer: No Typology Code available for payment source

## 2019-11-06 DIAGNOSIS — E538 Deficiency of other specified B group vitamins: Secondary | ICD-10-CM

## 2019-11-06 MED ORDER — CYANOCOBALAMIN 1000 MCG/ML IJ SOLN
1000.0000 ug | Freq: Once | INTRAMUSCULAR | Status: AC
  Administered 2019-11-06: 1000 ug via INTRAMUSCULAR

## 2019-11-06 NOTE — Patient Instructions (Signed)
Health Maintenance Due  Topic Date Due  . HIV Screening  Never done    Depression screen [facility] 2/9 02/28/2019 01/05/2018  Decreased Interest 0 0  Down, Depressed, Hopeless 0 -  PHQ - 2 Score 0 0

## 2019-11-06 NOTE — Progress Notes (Signed)
Per orders of Dr. Bryan Lemma injection of B12 given by Verline Lema L Tyus in right arm. Patient tolerated injection well. Patient will make appointment for 1 week.

## 2019-11-12 ENCOUNTER — Other Ambulatory Visit: Payer: Self-pay

## 2019-11-13 ENCOUNTER — Ambulatory Visit (INDEPENDENT_AMBULATORY_CARE_PROVIDER_SITE_OTHER): Payer: No Typology Code available for payment source

## 2019-11-13 DIAGNOSIS — E538 Deficiency of other specified B group vitamins: Secondary | ICD-10-CM | POA: Diagnosis not present

## 2019-11-13 MED ORDER — CYANOCOBALAMIN 1000 MCG/ML IJ SOLN
1000.0000 ug | Freq: Once | INTRAMUSCULAR | Status: AC
  Administered 2019-11-13: 11:00:00 1000 ug via INTRAMUSCULAR

## 2019-11-13 NOTE — Progress Notes (Signed)
Pt came into the office to get 3rd weekly B12 injection today, gave injection into the right arm, pt tolerated injection well. 4th B12 injection scheduled 11/20/2019.   After 4th weekly B12 injection--pt to get 1 a month for 4 months.

## 2019-11-14 NOTE — Progress Notes (Signed)
Medical screening examination/treatment/procedure(s) were performed by the LPN. As primary care provider I was immediately available for consulation/collaboration. I agree with above documentation. Lavonda Thal, AGNP-C 

## 2019-11-19 ENCOUNTER — Other Ambulatory Visit: Payer: Self-pay

## 2019-11-20 ENCOUNTER — Ambulatory Visit (INDEPENDENT_AMBULATORY_CARE_PROVIDER_SITE_OTHER): Payer: No Typology Code available for payment source

## 2019-11-20 DIAGNOSIS — E538 Deficiency of other specified B group vitamins: Secondary | ICD-10-CM | POA: Diagnosis not present

## 2019-11-20 MED ORDER — CYANOCOBALAMIN 1000 MCG/ML IJ SOLN
1000.0000 ug | Freq: Once | INTRAMUSCULAR | Status: AC
  Administered 2019-11-20: 14:00:00 1000 ug via INTRAMUSCULAR

## 2019-11-20 NOTE — Progress Notes (Signed)
Pt came into the office to get his 4th weekly B12 injection today, gave injection into the left arm, pt tolerated injection well. 1st monthly B12 injection start on 12/20/2019.   Monthly b12 injection for 4 months.

## 2019-11-21 NOTE — Progress Notes (Signed)
Medical screening examination/treatment/procedure(s) were performed by the LPN. As primary care provider I was immediately available for consulation/collaboration. I agree with above documentation. Aleece Loyd, AGNP-C 

## 2019-12-19 ENCOUNTER — Other Ambulatory Visit: Payer: Self-pay

## 2019-12-20 ENCOUNTER — Ambulatory Visit (INDEPENDENT_AMBULATORY_CARE_PROVIDER_SITE_OTHER): Payer: No Typology Code available for payment source

## 2019-12-20 DIAGNOSIS — D510 Vitamin B12 deficiency anemia due to intrinsic factor deficiency: Secondary | ICD-10-CM | POA: Diagnosis not present

## 2019-12-20 MED ORDER — CYANOCOBALAMIN 1000 MCG/ML IJ SOLN
1000.0000 ug | Freq: Once | INTRAMUSCULAR | Status: AC
  Administered 2019-12-20: 1000 ug via INTRAMUSCULAR

## 2019-12-20 NOTE — Progress Notes (Addendum)
After obtaining consent, and per orders of Dr. Bryan Lemma, injection of B-12 given in right deltoid by Shreena Baines Berneta Sages. Patient instructed to remain in clinic for 20 minutes afterwards, and to report any adverse reaction to me immediately.  Reviewed and agree.

## 2020-01-21 ENCOUNTER — Other Ambulatory Visit: Payer: Self-pay

## 2020-01-22 ENCOUNTER — Ambulatory Visit (INDEPENDENT_AMBULATORY_CARE_PROVIDER_SITE_OTHER): Payer: No Typology Code available for payment source

## 2020-01-22 DIAGNOSIS — E538 Deficiency of other specified B group vitamins: Secondary | ICD-10-CM | POA: Diagnosis not present

## 2020-01-22 MED ORDER — CYANOCOBALAMIN 1000 MCG/ML IJ SOLN
1000.0000 ug | Freq: Once | INTRAMUSCULAR | Status: AC
  Administered 2020-01-22: 1000 ug via INTRAMUSCULAR

## 2020-01-22 NOTE — Patient Instructions (Signed)
Health Maintenance Due  Topic Date Due  . Hepatitis C Screening  Never done  . HIV Screening  Never done    Depression screen [facility] 2/9 02/28/2019 01/05/2018  Decreased Interest 0 0  Down, Depressed, Hopeless 0 -  PHQ - 2 Score 0 0

## 2020-01-22 NOTE — Progress Notes (Signed)
Per orders of Dr. Cirigliano injection of B12 given by Kristyanna Barcelo L Yalitza Teed in right deltoid. Patient tolerated injection well. Patient will make appointment for 1 month.   

## 2020-01-22 NOTE — Progress Notes (Signed)
Medical screening examination/treatment/procedure(s) were performed by the CMA. As primary care provider I was immediately available for consulation/collaboration. I agree with above documentation. Marshay Slates, AGNP-C 

## 2020-02-18 ENCOUNTER — Other Ambulatory Visit: Payer: Self-pay

## 2020-02-19 ENCOUNTER — Ambulatory Visit (INDEPENDENT_AMBULATORY_CARE_PROVIDER_SITE_OTHER): Payer: No Typology Code available for payment source

## 2020-02-19 DIAGNOSIS — E538 Deficiency of other specified B group vitamins: Secondary | ICD-10-CM | POA: Diagnosis not present

## 2020-02-19 MED ORDER — CYANOCOBALAMIN 1000 MCG/ML IJ SOLN
1000.0000 ug | Freq: Once | INTRAMUSCULAR | Status: AC
  Administered 2020-02-19: 1000 ug via INTRAMUSCULAR

## 2020-02-19 NOTE — Progress Notes (Signed)
..  After obtaining consent, and per orders of Dr. Bryan Lemma, injection of B-12 given by Lynda Rainwater right deltoid. Patient instructed to remain in clinic for 20 minutes afterwards, and to report any adverse reaction to me immediately.

## 2020-02-20 NOTE — Progress Notes (Signed)
Medical screening examination/treatment/procedure(s) were performed by the CMA. As primary care provider I was immediately available for consulation/collaboration. I agree with above documentation. Iver Fehrenbach, AGNP-C 

## 2020-03-08 MED FILL — FLOVENT HFA 110 MCG INHALER: 110 | 30 days supply | Qty: 12 | Fill #1

## 2020-03-20 ENCOUNTER — Other Ambulatory Visit: Payer: Self-pay

## 2020-03-21 ENCOUNTER — Ambulatory Visit (INDEPENDENT_AMBULATORY_CARE_PROVIDER_SITE_OTHER): Payer: No Typology Code available for payment source | Admitting: Family Medicine

## 2020-03-21 ENCOUNTER — Encounter: Payer: Self-pay | Admitting: Family Medicine

## 2020-03-21 VITALS — BP 132/86 | HR 83 | Temp 97.8°F | Ht 74.0 in | Wt 196.8 lb

## 2020-03-21 DIAGNOSIS — Z0000 Encounter for general adult medical examination without abnormal findings: Secondary | ICD-10-CM | POA: Diagnosis not present

## 2020-03-21 DIAGNOSIS — Z1283 Encounter for screening for malignant neoplasm of skin: Secondary | ICD-10-CM

## 2020-03-21 DIAGNOSIS — R0789 Other chest pain: Secondary | ICD-10-CM

## 2020-03-21 DIAGNOSIS — Z8709 Personal history of other diseases of the respiratory system: Secondary | ICD-10-CM | POA: Diagnosis not present

## 2020-03-21 DIAGNOSIS — E538 Deficiency of other specified B group vitamins: Secondary | ICD-10-CM | POA: Diagnosis not present

## 2020-03-21 LAB — LIPID PANEL
Cholesterol: 209 mg/dL — ABNORMAL HIGH (ref 0–200)
HDL: 48.5 mg/dL (ref >39.00)
LDL Cholesterol: 146 mg/dL — ABNORMAL HIGH (ref 0–99)
NonHDL: 160.15
Total CHOL/HDL Ratio: 4
Triglycerides: 70 mg/dL (ref 0.0–149.0)
VLDL: 14 mg/dL (ref 0.0–40.0)

## 2020-03-21 LAB — VITAMIN B12: Vitamin B-12: 400 pg/mL (ref 211–911)

## 2020-03-21 LAB — BASIC METABOLIC PANEL
BUN: 15 mg/dL (ref 6–23)
CO2: 28 mEq/L (ref 19–32)
Calcium: 9.9 mg/dL (ref 8.4–10.5)
Chloride: 102 mEq/L (ref 96–112)
Creatinine, Ser: 0.98 mg/dL (ref 0.40–1.50)
GFR: 90.95 mL/min (ref >60.00)
Glucose, Bld: 98 mg/dL (ref 70–99)
Potassium: 4.1 mEq/L (ref 3.5–5.1)
Sodium: 139 mEq/L (ref 135–145)

## 2020-03-21 LAB — ALT: ALT: 28 U/L (ref 0–53)

## 2020-03-21 LAB — AST: AST: 20 U/L (ref 0–37)

## 2020-03-21 NOTE — Progress Notes (Signed)
David Best is a 29 y.o. male  Chief Complaint  Patient presents with  . Annual Exam    CPE-fasting    HPI: David Best is a 29 y.o. male here for annual CPE, fasting labs. He notes fatigue, currently receiving B12 injections.  He gets occasional chest tightness with exertion, improved with flovent, but still present. Nothing at rest. Pt has developed some heartburn, takes TUMS 1-2x/wk. No nausea, vomiting. Normal appetite.   Diet/Exercise: he has significantly reduced his caffeine intake, overall balanced diet, drinks plenty of water; runs occcasionally Dental: UTD Vision: no issues and no corrective lenses  Med refills needed today: none  He requests derm referral for routine skin/mole check  Past Medical History:  Diagnosis Date  . Asthma    as a child    Past Surgical History:  Procedure Laterality Date  . INGUINAL HERNIA REPAIR Left     Social History   Socioeconomic History  . Marital status: Married    Spouse name: Not on file  . Number of children: Not on file  . Years of education: Not on file  . Highest education level: Not on file  Occupational History  . Not on file  Tobacco Use  . Smoking status: Never Smoker  . Smokeless tobacco: Never Used  Vaping Use  . Vaping Use: Never used  Substance and Sexual Activity  . Alcohol use: Yes    Comment: 1-2 beer weekly  . Drug use: Never  . Sexual activity: Not on file  Other Topics Concern  . Not on file  Social History Narrative  . Not on file   Social Determinants of Health   Financial Resource Strain:   . Difficulty of Paying Living Expenses: Not on file  Food Insecurity:   . Worried About Charity fundraiser in the Last Year: Not on file  . Ran Out of Food in the Last Year: Not on file  Transportation Needs:   . Lack of Transportation (Medical): Not on file  . Lack of Transportation (Non-Medical): Not on file  Physical Activity:   . Days of Exercise per Week: Not on file   . Minutes of Exercise per Session: Not on file  Stress:   . Feeling of Stress : Not on file  Social Connections:   . Frequency of Communication with Friends and Family: Not on file  . Frequency of Social Gatherings with Friends and Family: Not on file  . Attends Religious Services: Not on file  . Active Member of Clubs or Organizations: Not on file  . Attends Archivist Meetings: Not on file  . Marital Status: Not on file  Intimate Partner Violence:   . Fear of Current or Ex-Partner: Not on file  . Emotionally Abused: Not on file  . Physically Abused: Not on file  . Sexually Abused: Not on file    Family History  Problem Relation Age of Onset  . Hypertension Father   . Diabetes Paternal Grandmother   . Transient ischemic attack Paternal Grandfather       There is no immunization history on file for this patient.  Outpatient Encounter Medications as of 03/21/2020  Medication Sig  . fluticasone (FLOVENT HFA) 110 MCG/ACT inhaler Inhale 1 puff into the lungs 2 (two) times daily.   No facility-administered encounter medications on file as of 03/21/2020.     ROS: Gen: no fever, chills; + fatigue Skin: no rash, itching ENT: no ear pain, ear  drainage, nasal congestion, rhinorrhea, sinus pressure, sore throat Eyes: no blurry vision, double vision Resp: no cough, wheeze,SOB CV: no CP, palpitations, LE edema,  GI: no heartburn, n/v/d/c, abd pain GU: no dysuria, urgency, frequency, hematuria; no testicular swelling or masses MSK: no joint pain, myalgias, back pain Neuro: no dizziness, headache, weakness, vertigo Psych: no depression, anxiety, insomnia   No Known Allergies  BP 132/86   Pulse 83   Temp 97.8 F (36.6 C) (Temporal)   Ht 6\' 2"  (1.88 m)   Wt 196 lb 12.8 oz (89.3 kg)   SpO2 95%   BMI 25.27 kg/m    Wt Readings from Last 3 Encounters:  03/21/20 196 lb 12.8 oz (89.3 kg)  10/23/19 192 lb 3.2 oz (87.2 kg)  09/06/19 189 lb 12.8 oz (86.1 kg)     Physical Exam Constitutional:      General: He is not in acute distress.    Appearance: He is well-developed.  HENT:     Head: Normocephalic and atraumatic.     Right Ear: Tympanic membrane and ear canal normal.     Left Ear: Tympanic membrane and ear canal normal.     Nose: Nose normal.  Neck:     Thyroid: No thyromegaly.  Cardiovascular:     Rate and Rhythm: Normal rate and regular rhythm.     Heart sounds: Normal heart sounds.  Pulmonary:     Effort: Pulmonary effort is normal.     Breath sounds: Normal breath sounds. No wheezing or rhonchi.  Abdominal:     General: Bowel sounds are normal. There is no distension.     Palpations: Abdomen is soft. There is no mass.     Tenderness: There is no abdominal tenderness. There is no guarding or rebound.  Musculoskeletal:        General: Normal range of motion.     Cervical back: Neck supple.  Lymphadenopathy:     Cervical: No cervical adenopathy.  Skin:    General: Skin is warm and dry.  Neurological:     Mental Status: He is alert and oriented to person, place, and time.     Motor: No abnormal muscle tone.     Coordination: Coordination normal.      A/P:  1. Annual physical exam - discussed importance of regular CV exercise, healthy diet, adequate sleep - UTD on dental exam - UTD on immunizations - ALT - AST - Basic metabolic panel - Lipid panel - next CPE in 1 year  2. B12 deficiency - receiving B12 injections  - Vitamin B12  3. Chest tightness 4. H/O intrinsic asthma - some improvement with flovent BID - Ambulatory referral to Pulmonology  5. Skin cancer screening - Ambulatory referral to Dermatology    This visit occurred during the SARS-CoV-2 public health emergency.  Safety protocols were in place, including screening questions prior to the visit, additional usage of staff PPE, and extensive cleaning of exam room while observing appropriate contact time as indicated for disinfecting solutions.

## 2020-03-26 ENCOUNTER — Encounter: Payer: Self-pay | Admitting: Family Medicine

## 2020-05-07 ENCOUNTER — Ambulatory Visit: Payer: No Typology Code available for payment source | Attending: Internal Medicine

## 2020-05-07 ENCOUNTER — Other Ambulatory Visit (HOSPITAL_COMMUNITY): Payer: Self-pay | Admitting: Internal Medicine

## 2020-05-07 DIAGNOSIS — Z23 Encounter for immunization: Secondary | ICD-10-CM

## 2020-05-07 NOTE — Progress Notes (Signed)
   Covid-19 Vaccination Clinic  Name:  David Best    MRN: [id number] DOB: [birth year]  05/07/2020  Mr. David Best was observed post Covid-19 immunization for 15 minutes without incident. He was provided with Vaccine Information Sheet and instruction to access the V-Safe system.   Mr. David Best was instructed to call 911 with any severe reactions post vaccine: Marland Kitchen Difficulty breathing  . Swelling of face and throat  . A fast heartbeat  . A bad rash all over body  . Dizziness and weakness

## 2020-05-13 ENCOUNTER — Other Ambulatory Visit: Payer: Self-pay

## 2020-05-13 ENCOUNTER — Other Ambulatory Visit: Payer: Self-pay | Admitting: Pulmonary Disease

## 2020-05-13 ENCOUNTER — Ambulatory Visit (INDEPENDENT_AMBULATORY_CARE_PROVIDER_SITE_OTHER): Payer: No Typology Code available for payment source | Admitting: Pulmonary Disease

## 2020-05-13 ENCOUNTER — Encounter: Payer: Self-pay | Admitting: Pulmonary Disease

## 2020-05-13 VITALS — BP 126/68 | HR 99 | Temp 97.2°F | Ht 74.0 in | Wt 198.6 lb

## 2020-05-13 DIAGNOSIS — J4530 Mild persistent asthma, uncomplicated: Secondary | ICD-10-CM | POA: Diagnosis not present

## 2020-05-13 DIAGNOSIS — K219 Gastro-esophageal reflux disease without esophagitis: Secondary | ICD-10-CM

## 2020-05-13 MED ORDER — OMEPRAZOLE 20 MG PO CPDR: 20.0000 mg | DELAYED_RELEASE_CAPSULE | Freq: Every day | ORAL | 11 refills | Status: DC

## 2020-05-13 MED ORDER — ALBUTEROL SULFATE HFA 108 (90 BASE) MCG/ACT IN AERS
2.0000 | INHALATION_SPRAY | Freq: Four times a day (QID) | RESPIRATORY_TRACT | 6 refills | Status: DC | PRN
Start: 2020-05-13 — End: 2020-05-13

## 2020-05-13 MED FILL — ALBUTEROL SULFATE HFA 108 (: 108 (90 BAS | 25 days supply | Qty: 18 | Fill #0

## 2020-05-13 MED FILL — OMEPRAZOLE 20 MG CAP: 20 | 30 days supply | Qty: 30 | Fill #0

## 2020-05-13 NOTE — Progress Notes (Signed)
Synopsis: Referred by Letta Median, DO for asthma  Subjective:   PATIENT ID: [name] GENDER: male DOB: 1991/01/11, MRN: [id number]   HPI  Chief Complaint  Patient presents with  . Consult    intermittent chest tightness with cough with clear phlegm for the past year   Benard Halsted is a 29 year old male, never smoker who is referred to pulmonary clinic for evaluation of asthma.  He reports developing shortness of breath, chest tightness and cough last fall when he began to run for exercise. He was evaluated by his primary care and was provided with flovent inhaler to be used as needed for concern of asthma. He reports he has received improvement with the inhaler. He continues to experience cough though, which is worse in the morning time. He coughs up clear sputum. He does report seasonal allergies that are worse in the spring and fall in which he uses allegra. He denies sinus congestion or drainage. He does have reflux symptoms fairly frequently and does not take anything for this. He has history of childhood asthma where he was on adviar daily during the summer for swimming as he reports cholerine exposure aggravated his breathing.     No family history of asthma or lung disease. 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   Past Medical History:  Diagnosis Date  . Asthma    as a child     Family History  Problem Relation Age of Onset  . Hypertension Father   . Diabetes Paternal Grandmother   . Transient ischemic attack Paternal Grandfather      Social History   Socioeconomic History  . Marital status: Married    Spouse name: Not on file  . Number of children: Not on file  . Years of education: Not on file  . Highest education level: Not on file  Occupational History  . Not on file  Tobacco Use  . Smoking status: Never Smoker  . Smokeless tobacco: Never Used  Vaping Use  . Vaping Use: Never used  Substance and Sexual Activity  .  Alcohol use: Yes    Comment: 1-2 beer weekly  . Drug use: Never  . Sexual activity: Not on file  Other Topics Concern  . Not on file  Social History Narrative  . Not on file   Social Determinants of Health   Financial Resource Strain:   . Difficulty of Paying Living Expenses: Not on file  Food Insecurity:   . Worried About Charity fundraiser in the Last Year: Not on file  . Ran Out of Food in the Last Year: Not on file  Transportation Needs:   . Lack of Transportation (Medical): Not on file  . Lack of Transportation (Non-Medical): Not on file  Physical Activity:   . Days of Exercise per Week: Not on file  . Minutes of Exercise per Session: Not on file  Stress:   . Feeling of Stress : Not on file  Social Connections:   . Frequency of Communication with Friends and Family: Not on file  . Frequency of Social Gatherings with Friends and Family: Not on file  . Attends Religious Services: Not on file  . Active Member of Clubs or Organizations: Not on file  . Attends Archivist Meetings: Not on file  . Marital Status: Not on file  Intimate Partner Violence:   . Fear of Current or Ex-Partner: Not on file  . Emotionally Abused:  Not on file  . Physically Abused: Not on file  . Sexually Abused: Not on file     No Known Allergies   Outpatient Medications Prior to Visit  Medication Sig Dispense Refill  . fluticasone (FLOVENT HFA) 110 MCG/ACT inhaler Inhale 1 puff into the lungs 2 (two) times daily. 1 Inhaler 1   No facility-administered medications prior to visit.    Review of Systems  Constitutional: Negative for chills, diaphoresis, fever, malaise/fatigue and weight loss.  HENT: Negative for congestion, sinus pain and sore throat.   Eyes: Negative for blurred vision.  Respiratory: Positive for cough and shortness of breath. Negative for hemoptysis, sputum production and wheezing.   Cardiovascular: Negative for chest pain, palpitations, orthopnea, claudication, leg  swelling and PND.  Gastrointestinal: Positive for heartburn. Negative for abdominal pain, blood in stool, constipation, diarrhea, nausea and vomiting.  Genitourinary: Negative for hematuria.  Musculoskeletal: Negative for joint pain and myalgias.  Skin: Negative for itching and rash.  Neurological: Negative for dizziness, weakness and headaches.  Endo/Heme/Allergies: Does not bruise/bleed easily.  Psychiatric/Behavioral: Negative.     Objective:   Vitals:   05/13/20 1359  BP: 126/68  Pulse: 99  Temp: (!) 97.2 F (36.2 C)  TempSrc: Other (Comment)  SpO2: 99%  Weight: 198 lb 9.6 oz (90.1 kg)  Height: 6\' 2"  (1.88 m)     Physical Exam Constitutional:      General: He is not in acute distress.    Appearance: Normal appearance. He is normal weight. He is not ill-appearing.  HENT:     Head: Normocephalic and atraumatic.     Nose: Nose normal.     Mouth/Throat:     Mouth: Mucous membranes are moist.     Pharynx: Oropharynx is clear.  Eyes:     General: No scleral icterus.    Extraocular Movements: Extraocular movements intact.     Conjunctiva/sclera: Conjunctivae normal.     Pupils: Pupils are equal, round, and reactive to light.  Cardiovascular:     Rate and Rhythm: Normal rate and regular rhythm.     Pulses: Normal pulses.     Heart sounds: Normal heart sounds.  Pulmonary:     Effort: Pulmonary effort is normal. No respiratory distress.     Breath sounds: Normal breath sounds. No wheezing, rhonchi or rales.  Abdominal:     General: Bowel sounds are normal.     Palpations: Abdomen is soft.  Musculoskeletal:     Right lower leg: No edema.     Left lower leg: No edema.  Skin:    Findings: No lesion or rash.  Neurological:     General: No focal deficit present.     Mental Status: He is alert and oriented to person, place, and time. Mental status is at baseline.     Gait: Gait normal.  Psychiatric:        Mood and Affect: Mood normal.        Behavior: Behavior  normal.        Thought Content: Thought content normal.        Judgment: Judgment normal.     CBC    Component Value Date/Time   WBC 7.5 10/23/2019 1351   RBC 4.79 10/23/2019 1351   HGB 15.7 10/23/2019 1351   HCT 45.0 10/23/2019 1351   PLT 220.0 10/23/2019 1351   MCV 94.0 10/23/2019 1351   MCHC 34.9 10/23/2019 1351   RDW 12.9 10/23/2019 1351   LYMPHSABS 1.8 02/28/2019 0957  MONOABS 0.5 02/28/2019 0957   EOSABS 0.0 02/28/2019 0957   BASOSABS 0.0 02/28/2019 0957    Chest imaging: Chest Radiograph - 06/06/2019 The heart size and mediastinal contours are within normal limits. Both lungs are clear. The visualized skeletal structures are Unremarkable.    Assessment & Plan:   Mild persistent asthma without complication - Plan: albuterol (VENTOLIN HFA) 108 (90 Base) MCG/ACT inhaler  Gastroesophageal reflux disease without esophagitis - Plan: omeprazole (PRILOSEC) 20 MG capsule  Discussion: Benard Halsted is a 29 year old male, never smoker who is referred to pulmonary clinic for evaluation of asthma.  Based on his clinical history, he apears to have mild persistent asthma. He is to continue flovent 116mcg 1-2 puffs twice daily. He can use albuterol inhaler 1-2 puffs every 4-6 hours as needed. He currently has active GERD which we will start on omeprazole therapy and monitor for improvements in his cough. If the reflux is well controlled in the future and he continues to have symptoms we can step up his inhaler therapy to ICS/LABA therapy.   Follow up in 4 months  Freda Jackson, MD [facility] Office: [phone]      Current Outpatient Medications:  .  fluticasone (FLOVENT HFA) 110 MCG/ACT inhaler, Inhale 1 puff into the lungs 2 (two) times daily., Disp: 1 Inhaler, Rfl: 1 .  albuterol (VENTOLIN HFA) 108 (90 Base) MCG/ACT inhaler, Inhale 2 puffs into the lungs every 6 (six) hours as needed for wheezing or shortness of breath., Disp: 8 g, Rfl:  6 .  omeprazole (PRILOSEC) 20 MG capsule, Take 1 capsule (20 mg total) by mouth daily., Disp: 30 capsule, Rfl: 11

## 2020-05-13 NOTE — Patient Instructions (Signed)
Use albuterol inhaler 1-2 puffs as needed for cough,chest tightness or shortness of breath Continue to use flovent 1-2 puffs twice daily. Can increase to 2 puffs twice daily during more symptomatic periods Start omeprazole 20mg  daily for reflux symptoms Follow up in 4 months  Food Choices for Gastroesophageal Reflux Disease, Adult When you have gastroesophageal reflux disease (GERD), the foods you eat and your eating habits are very important. Choosing the right foods can help ease the discomfort of GERD. Consider working with a diet and nutrition specialist (dietitian) to help you make healthy food choices. What general guidelines should I follow?  Eating plan  Choose healthy foods low in fat, such as fruits, vegetables, whole grains, low-fat dairy products, and lean meat, fish, and poultry.  Eat frequent, small meals instead of three large meals each day. Eat your meals slowly, in a relaxed setting. Avoid bending over or lying down until 2-3 hours after eating.  Limit high-fat foods such as fatty meats or fried foods.  Limit your intake of oils, butter, and shortening to less than 8 teaspoons each day.  Avoid the following: ? Foods that cause symptoms. These may be different for different people. Keep a food diary to keep track of foods that cause symptoms. ? Alcohol. ? Drinking large amounts of liquid with meals. ? Eating meals during the 2-3 hours before bed.  Cook foods using methods other than frying. This may include baking, grilling, or broiling. Lifestyle  Maintain a healthy weight. Ask your health care provider what weight is healthy for you. If you need to lose weight, work with your health care provider to do so safely.  Exercise for at least 30 minutes on 5 or more days each week, or as told by your health care provider.  Avoid wearing clothes that fit tightly around your waist and chest.  Do not use any products that contain nicotine or tobacco, such as cigarettes and  e-cigarettes. If you need help quitting, ask your health care provider.  Sleep with the head of your bed raised. Use a wedge under the mattress or blocks under the bed frame to raise the head of the bed. What foods are not recommended? The items listed may not be a complete list. Talk with your dietitian about what dietary choices are best for you. Grains Pastries or quick breads with added fat. [place] toast. Vegetables Deep fried vegetables. [place] fries. Any vegetables prepared with added fat. Any vegetables that cause symptoms. For some people this may include tomatoes and tomato products, chili peppers, onions and garlic, and horseradish. Fruits Any fruits prepared with added fat. Any fruits that cause symptoms. For some people this may include citrus fruits, such as oranges, grapefruit, pineapple, and lemons. Meats and other protein foods High-fat meats, such as fatty beef or pork, hot dogs, ribs, ham, sausage, salami and bacon. Fried meat or protein, including fried fish and fried chicken. Nuts and nut butters. Dairy Whole milk and chocolate milk. Sour cream. Cream. Ice cream. Cream cheese. Milk shakes. Beverages Coffee and tea, with or without caffeine. Carbonated beverages. Sodas. Energy drinks. Fruit juice made with acidic fruits (such as orange or grapefruit). Tomato juice. Alcoholic drinks. Fats and oils Butter. Margarine. Shortening. Ghee. Sweets and desserts Chocolate and cocoa. Donuts. Seasoning and other foods Pepper. Peppermint and spearmint. Any condiments, herbs, or seasonings that cause symptoms. For some people, this may include curry, hot sauce, or vinegar-based salad dressings. Summary  When you have gastroesophageal reflux disease (GERD), food and  lifestyle choices are very important to help ease the discomfort of GERD.  Eat frequent, small meals instead of three large meals each day. Eat your meals slowly, in a relaxed setting. Avoid bending over or lying down until  2-3 hours after eating.  Limit high-fat foods such as fatty meat or fried foods. This information is not intended to replace advice given to you by your health care provider. Make sure you discuss any questions you have with your health care provider. Document Revised: 11/02/2018 Document Reviewed: 07/13/2016 Elsevier Patient Education  [place].

## 2020-05-30 IMAGING — DX DG CHEST 2V
3 series · 3 of 3 positions shown · non-contrast
Comparison: June 05, 2019.

CLINICAL DATA: Possible right lung nodule.

EXAM:
CHEST - 2 VIEW

[chest pa (1 of 2)]
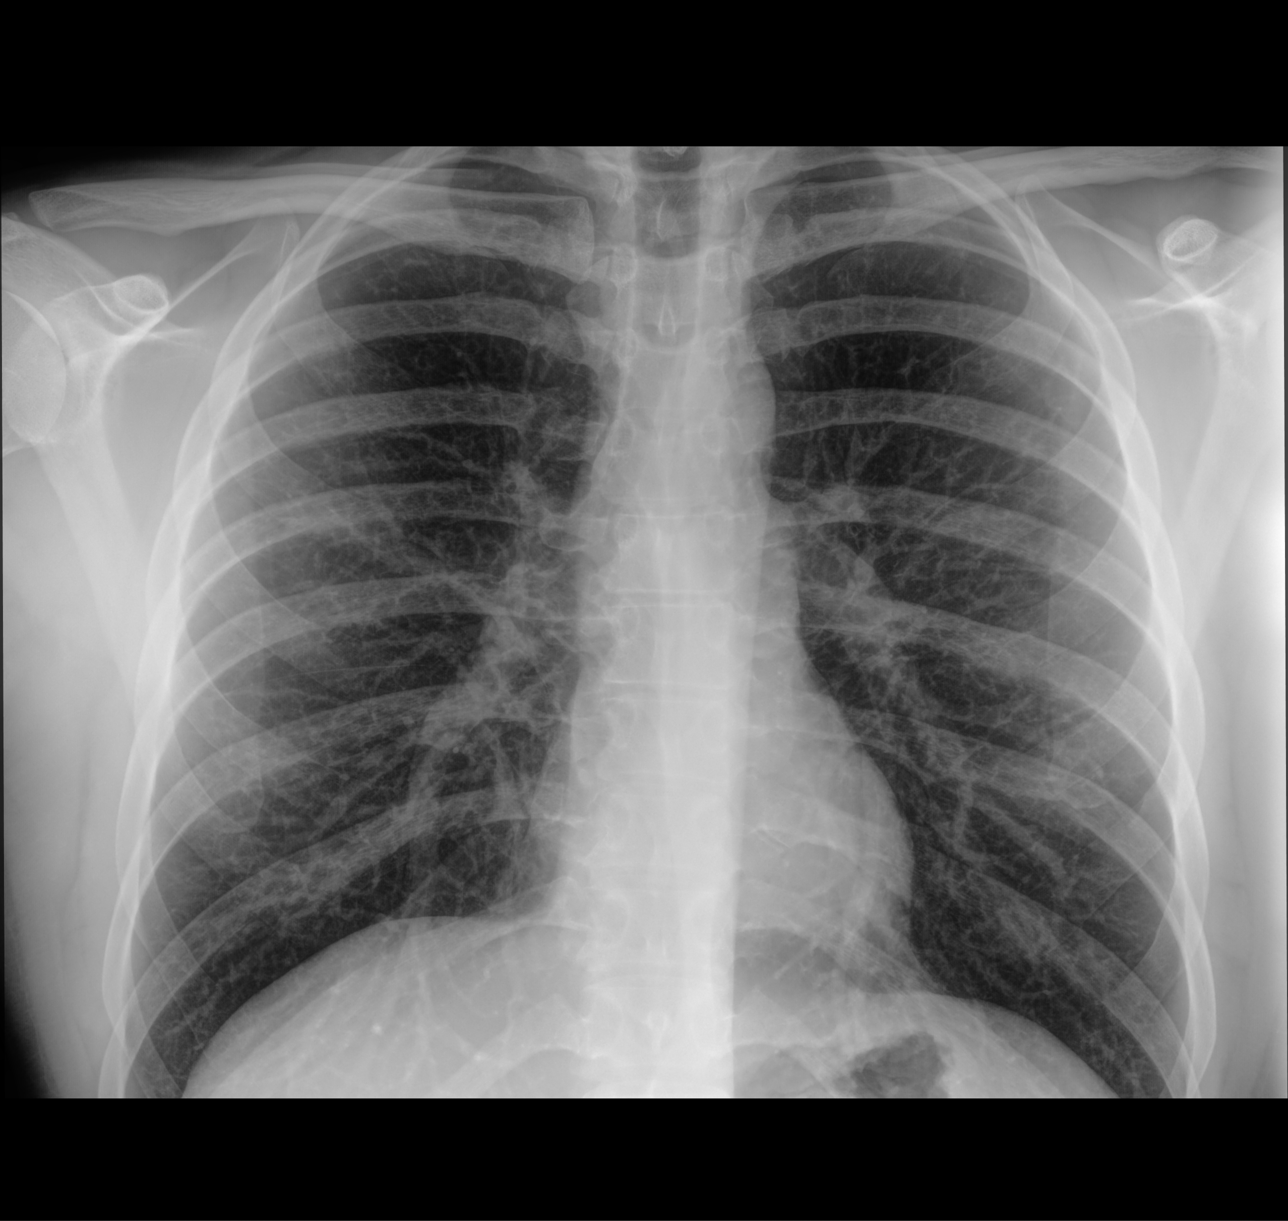

[chest pa (2 of 2)]
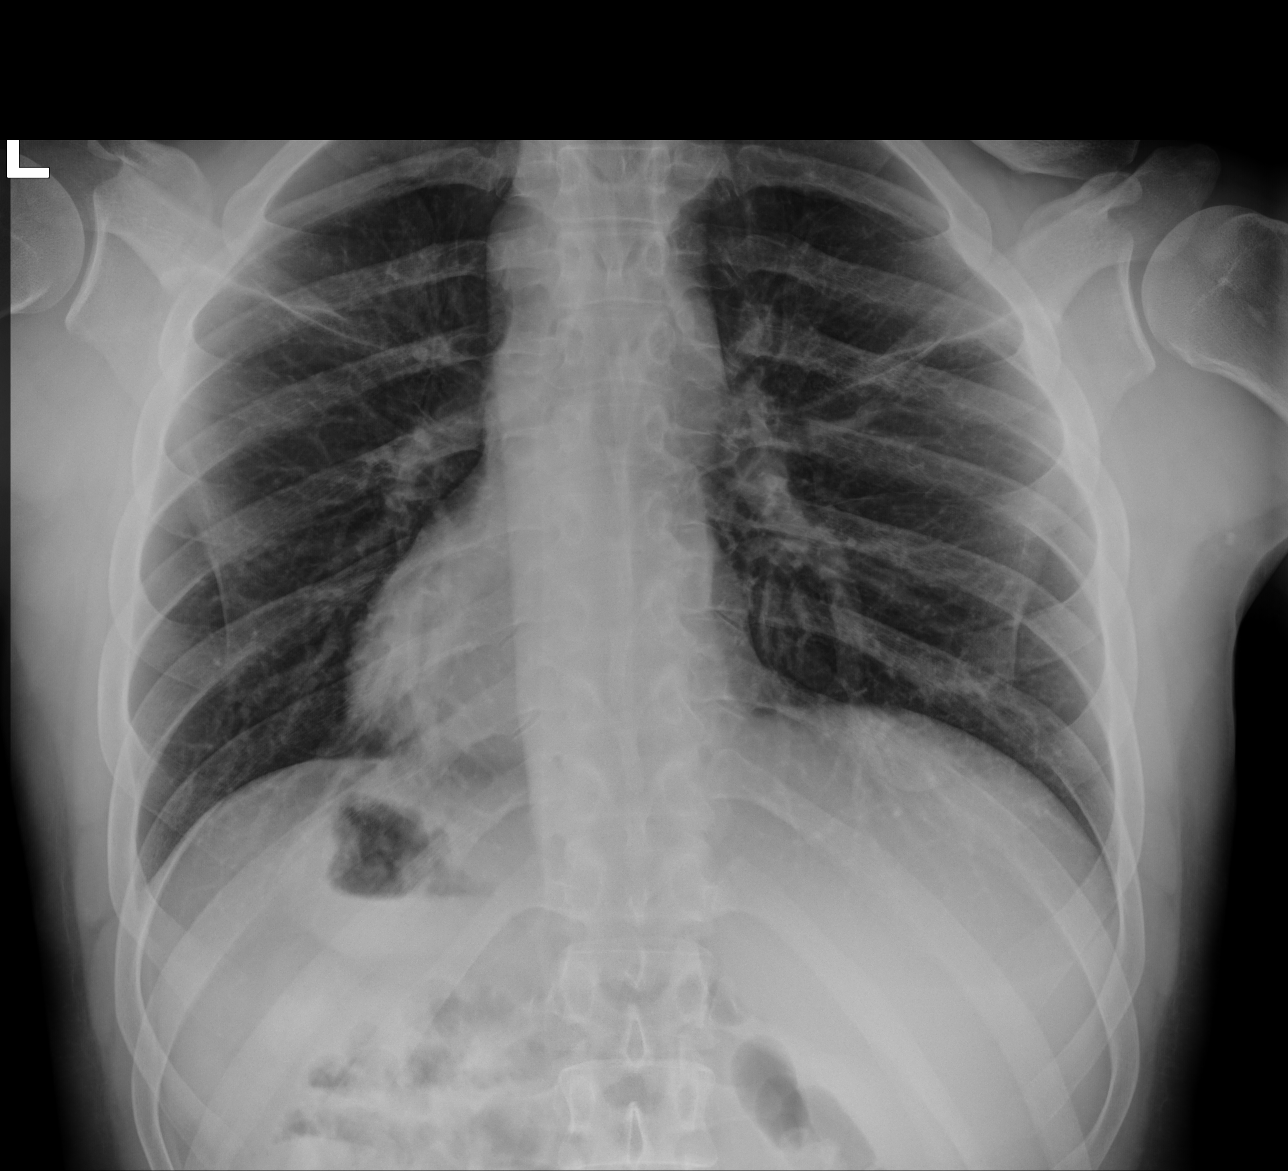

[chest lat]
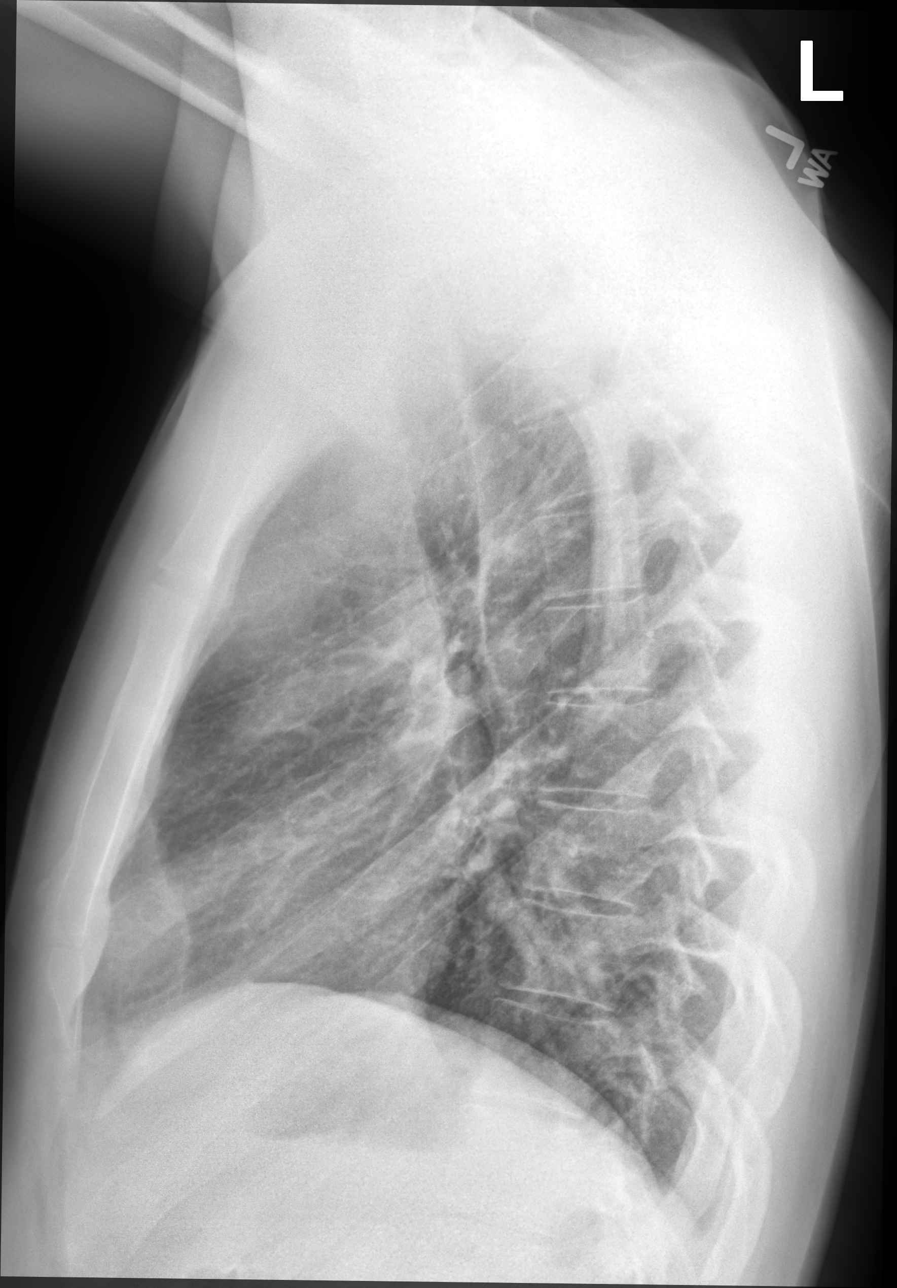

[3 of 3 positions shown; findings below may reference images not displayed]

FINDINGS: The heart size and mediastinal contours are within normal limits.
Both lungs are clear. The visualized skeletal structures are
unremarkable.
IMPRESSION: No active cardiopulmonary disease. Right apical nodular density
noted on prior exam is not visualized on this radiograph.

## 2020-06-17 MED FILL — OMEPRAZOLE 20 MG CAP: 20 | 30 days supply | Qty: 30 | Fill #1

## 2020-08-01 MED FILL — OMEPRAZOLE 20 MG CAP: 20 | 30 days supply | Qty: 30 | Fill #2

## 2020-08-27 ENCOUNTER — Encounter: Payer: Self-pay | Admitting: Dermatology

## 2020-08-27 ENCOUNTER — Other Ambulatory Visit: Payer: Self-pay

## 2020-08-27 ENCOUNTER — Ambulatory Visit (INDEPENDENT_AMBULATORY_CARE_PROVIDER_SITE_OTHER): Payer: No Typology Code available for payment source | Admitting: Dermatology

## 2020-08-27 DIAGNOSIS — Z1283 Encounter for screening for malignant neoplasm of skin: Secondary | ICD-10-CM | POA: Diagnosis not present

## 2020-08-27 DIAGNOSIS — D1800 Hemangioma unspecified site: Secondary | ICD-10-CM | POA: Diagnosis not present

## 2020-09-06 ENCOUNTER — Encounter: Payer: Self-pay | Admitting: Dermatology

## 2020-09-06 NOTE — Progress Notes (Signed)
   Follow-Up Visit   Subjective  David Best is a 30 y.o. male who presents for the following: Annual Exam (No new concerns).  General skin examination Location:  Duration:  Quality:  Associated Signs/Symptoms: Modifying Factors:  Severity:  Timing: Context:   Objective  Well appearing patient in no apparent distress; mood and affect are within normal limits. Objective  Right Breast: Full body skin examination- no atypical moles or non mole skin cancer  Objective  Left Abdomen (side) - Upper: Multiple red 74mm raised papules    A full examination was performed including scalp, head, eyes, ears, nose, lips, neck, chest, axillae, abdomen, back, buttocks, bilateral upper extremities, bilateral lower extremities, hands, feet, fingers, toes, fingernails, and toenails. All findings within normal limits unless otherwise noted below.   Assessment & Plan    Encounter for screening for malignant neoplasm of skin Right Breast  Yearly skin check  Hemangioma, unspecified site Left Abdomen (side) - Upper  Okay to leave if stable   Skin cancer screening performed today.    I, Lavonna Monarch, MD, have reviewed all documentation for this visit.  The documentation on 09/06/20 for the exam, diagnosis, procedures, and orders are all accurate and complete.

## 2020-09-22 MED FILL — OMEPRAZOLE 20 MG CAP: 20 | 30 days supply | Qty: 30 | Fill #3

## 2020-10-14 ENCOUNTER — Other Ambulatory Visit (HOSPITAL_BASED_OUTPATIENT_CLINIC_OR_DEPARTMENT_OTHER): Payer: Self-pay

## 2020-10-30 ENCOUNTER — Other Ambulatory Visit (HOSPITAL_COMMUNITY): Payer: Self-pay

## 2020-10-30 MED FILL — Omeprazole Cap Delayed Release 20 MG: ORAL | 30 days supply | Qty: 30 | Fill #0 | Status: AC

## 2020-12-17 ENCOUNTER — Other Ambulatory Visit (HOSPITAL_COMMUNITY): Payer: Self-pay

## 2020-12-17 MED FILL — Omeprazole Cap Delayed Release 20 MG: ORAL | 30 days supply | Qty: 30 | Fill #1 | Status: AC

## 2021-01-07 ENCOUNTER — Ambulatory Visit (INDEPENDENT_AMBULATORY_CARE_PROVIDER_SITE_OTHER): Payer: No Typology Code available for payment source | Admitting: Family Medicine

## 2021-01-07 ENCOUNTER — Encounter: Payer: Self-pay | Admitting: Family Medicine

## 2021-01-07 ENCOUNTER — Other Ambulatory Visit: Payer: Self-pay

## 2021-01-07 VITALS — BP 130/90 | HR 88 | Temp 98.1°F | Ht 74.0 in | Wt 194.8 lb

## 2021-01-07 DIAGNOSIS — K219 Gastro-esophageal reflux disease without esophagitis: Secondary | ICD-10-CM | POA: Diagnosis not present

## 2021-01-07 NOTE — Progress Notes (Signed)
David Best is a 30 y.o. male  Chief Complaint  Patient presents with   Gastroesophageal Reflux    Pt is here for GERD.  Pt has pain/buring when he does not take medication, pt also has been having mor e bloating.  He would like to know if there are any options of maybe not taking medication every day.    HPI: David Best is a 30 y.o. male patient who complains of GERD symptoms. They are controlled on omeprazole 20mg  daily. If he does not take the medication, pt has symptoms of reflux and bloating.  On med: Cough is gone, heartburn is gone.  Appetite is good. No early satiety. No issues with diarrhea or constipation. No vomiting. No unexplained weight loss. No abdominal pain. Pt is burping more, usually in the AM.  He is taking metamucil fiber supplement.  Wt Readings from Last 3 Encounters:  01/07/21 194 lb 12.8 oz (88.4 kg)  05/13/20 198 lb 9.6 oz (90.1 kg)  03/21/20 196 lb 12.8 oz (89.3 kg)     Past Medical History:  Diagnosis Date   Asthma    as a child    Past Surgical History:  Procedure Laterality Date   INGUINAL HERNIA REPAIR Left     Social History   Socioeconomic History   Marital status: Married    Spouse name: Not on file   Number of children: Not on file   Years of education: Not on file   Highest education level: Not on file  Occupational History   Not on file  Tobacco Use   Smoking status: Never   Smokeless tobacco: Never  Vaping Use   Vaping Use: Never used  Substance and Sexual Activity   Alcohol use: Yes    Comment: 1-2 beer weekly   Drug use: Never   Sexual activity: Not on file  Other Topics Concern   Not on file  Social History Narrative   Not on file   Social Determinants of Health   Financial Resource Strain: Not on file  Food Insecurity: Not on file  Transportation Needs: Not on file  Physical Activity: Not on file  Stress: Not on file  Social Connections: Not on file  Intimate Partner Violence: Not on  file    Family History  Problem Relation Age of Onset   Hypertension Father    Diabetes Paternal Grandmother    Transient ischemic attack Paternal Grandfather      Immunization History  Administered Date(s) Administered   Influenza Inj Mdck Quad Pf 04/18/2020   PFIZER(Purple Top)SARS-COV-2 Vaccination 07/25/2019, 08/17/2019, 05/07/2020    Outpatient Encounter Medications as of 01/07/2021  Medication Sig   albuterol (VENTOLIN HFA) 108 (90 Base) MCG/ACT inhaler INHALE 2 PUFFS INTO THE LUNGS EVERY 6 HOURS AS NEEDED FOR WHEEZING OR SHORTNESS OF BREATH.   fluticasone (FLOVENT HFA) 110 MCG/ACT inhaler Inhale 1 puff into the lungs 2 (two) times daily.   omeprazole (PRILOSEC) 20 MG capsule TAKE 1 CAPSULE BY MOUTH ONCE A DAY   [DISCONTINUED] COVID-19 mRNA vaccine, Pfizer, 30 MCG/0.3ML injection TO BE ADMINISTERED BY THE PHARMACIST (Patient not taking: Reported on 01/07/2021)   No facility-administered encounter medications on file as of 01/07/2021.     ROS: Pertinent positives and negatives noted in HPI. Remainder of ROS non-contributory  No Known Allergies  BP 130/90 (BP Location: Left Arm, Patient Position: Sitting, Cuff Size: Normal)   Pulse 88   Temp 98.1 F (36.7 C) (Oral)  Ht 6\' 2"  (1.88 m)   Wt 194 lb 12.8 oz (88.4 kg)   SpO2 99%   BMI 25.01 kg/m  Wt Readings from Last 3 Encounters:  01/07/21 194 lb 12.8 oz (88.4 kg)  05/13/20 198 lb 9.6 oz (90.1 kg)  03/21/20 196 lb 12.8 oz (89.3 kg)   Temp Readings from Last 3 Encounters:  01/07/21 98.1 F (36.7 C) (Oral)  05/13/20 (!) 97.2 F (36.2 C) (Other (Comment))  03/21/20 97.8 F (36.6 C) (Temporal)   BP Readings from Last 3 Encounters:  01/07/21 130/90  05/13/20 126/68  03/21/20 132/86   Pulse Readings from Last 3 Encounters:  01/07/21 88  05/13/20 99  03/21/20 83     Physical Exam Constitutional:      General: He is not in acute distress.    Appearance: He is not ill-appearing.  Pulmonary:     Effort: No  respiratory distress.  Neurological:     Mental Status: He is alert and oriented to person, place, and time.  Psychiatric:        Mood and Affect: Mood normal.        Behavior: Behavior normal.     A/P:  1. Gastroesophageal reflux disease, unspecified whether esophagitis present - controlled on omeprazole 20mg  daily, recommend pt continue on this - diet modification to avoid trigger foods, limit portion size, avoid eating 2-3hrs before bed  - f/u PRN if symptoms worsen or become uncontrolled   This visit occurred during the SARS-CoV-2 public health emergency.  Safety protocols were in place, including screening questions prior to the visit, additional usage of staff PPE, and extensive cleaning of exam room while observing appropriate contact time as indicated for disinfecting solutions.

## 2021-01-22 ENCOUNTER — Other Ambulatory Visit (HOSPITAL_COMMUNITY): Payer: Self-pay

## 2021-01-22 MED FILL — Omeprazole Cap Delayed Release 20 MG: ORAL | 30 days supply | Qty: 30 | Fill #2 | Status: AC

## 2021-02-03 ENCOUNTER — Encounter: Payer: Self-pay | Admitting: Family Medicine

## 2021-02-03 DIAGNOSIS — R1013 Epigastric pain: Secondary | ICD-10-CM

## 2021-02-03 DIAGNOSIS — K219 Gastro-esophageal reflux disease without esophagitis: Secondary | ICD-10-CM

## 2021-02-26 ENCOUNTER — Other Ambulatory Visit (HOSPITAL_COMMUNITY): Payer: Self-pay

## 2021-02-26 MED FILL — Omeprazole Cap Delayed Release 20 MG: ORAL | 30 days supply | Qty: 30 | Fill #3 | Status: AC

## 2021-03-31 ENCOUNTER — Ambulatory Visit (INDEPENDENT_AMBULATORY_CARE_PROVIDER_SITE_OTHER): Payer: No Typology Code available for payment source | Admitting: Gastroenterology

## 2021-03-31 ENCOUNTER — Encounter: Payer: Self-pay | Admitting: Gastroenterology

## 2021-03-31 ENCOUNTER — Other Ambulatory Visit: Payer: Self-pay

## 2021-03-31 VITALS — BP 130/88 | HR 91 | Ht 74.0 in | Wt 202.0 lb

## 2021-03-31 DIAGNOSIS — R140 Abdominal distension (gaseous): Secondary | ICD-10-CM

## 2021-03-31 DIAGNOSIS — R1012 Left upper quadrant pain: Secondary | ICD-10-CM

## 2021-03-31 DIAGNOSIS — K219 Gastro-esophageal reflux disease without esophagitis: Secondary | ICD-10-CM

## 2021-03-31 DIAGNOSIS — R1310 Dysphagia, unspecified: Secondary | ICD-10-CM | POA: Diagnosis not present

## 2021-03-31 DIAGNOSIS — R1013 Epigastric pain: Secondary | ICD-10-CM

## 2021-03-31 NOTE — Progress Notes (Signed)
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  Index symptoms of heartburn, belching, and dry cough.  Has had sxs for a few years. No dysphagia per se, but "food sometimes goes slowly through esophagus".  No nocturnal symptoms.  Symptoms initially resolved with omeprazole 20 mg/day.  Does tend to have breakthrough with any missed doses.    More recently started having breakthrough sxs of dyspepsia/LUQ pain, and belching despite continued omeprazole pre-breakfast. Describes as dull ache/discomfort rather than frank pain.  Tends to improve with eating.  Good appetite, no nausea/vomiting.  Weight stable.  Trialed course of Carafate in 01/2021.  No previous EGD.  No recent abdominal imaging for review.  PGF s/p Whipple Surgery for Pancreatic NET.    Past Medical History:  Diagnosis Date   Asthma    as a child     Past Surgical History:  Procedure Laterality Date   INGUINAL HERNIA REPAIR Left    Family History  Problem Relation Age of Onset   Hypertension Father    Diabetes Paternal Grandmother    Transient ischemic attack Paternal Grandfather    Social History   Tobacco Use   Smoking status: Never   Smokeless tobacco: Never  Vaping Use   Vaping Use: Never used  Substance Use Topics   Alcohol use: Yes    Comment: 1-2 beer weekly   Drug use: Never   Current Outpatient Medications  Medication Sig Dispense Refill   albuterol (VENTOLIN HFA) 108 (90 Base) MCG/ACT inhaler INHALE 2 PUFFS INTO THE LUNGS EVERY 6 HOURS AS NEEDED FOR WHEEZING OR SHORTNESS OF BREATH. 18 g 6   fluticasone (FLOVENT HFA) 110 MCG/ACT inhaler Inhale 1 puff into the lungs 2 (two) times daily. 1 Inhaler 1   omeprazole (PRILOSEC) 20 MG capsule TAKE 1 CAPSULE BY MOUTH ONCE A DAY 30 capsule 11   No  current facility-administered medications for this visit.   No Known Allergies   Review of Systems: All systems reviewed and negative except where noted in HPI.     Physical Exam:    Wt Readings from Last 3 Encounters:  01/07/21 194 lb 12.8 oz (88.4 kg)  05/13/20 198 lb 9.6 oz (90.1 kg)  03/21/20 196 lb 12.8 oz (89.3 kg)    There were no vitals taken for this visit. Constitutional:  Pleasant, in no acute distress. Psychiatric: Normal mood and affect. Behavior is normal. EENT: Pupils normal.  Conjunctivae are normal. No scleral icterus. Neck supple. No cervical LAD. Cardiovascular: Normal rate, regular rhythm. No edema Pulmonary/chest: Effort normal and breath sounds normal. No wheezing, rales or rhonchi. Abdominal: Soft, nondistended, nontender. Bowel sounds active throughout. There are no masses palpable. No hepatomegaly. Neurological: Alert and oriented to person place and time. Skin: Skin is warm and dry. No rashes noted.   ASSESSMENT AND PLAN;   1) GERD 2) Dyspepsia 3) LUQ pain 4) Dysphagia 5) Abdominal bloating  - EGD to evaluate for erosive esophagitis, hiatal hernia, LES laxity along with PUD, gastritis, esophageal stricture with biopsies and esophageal dilation as appropriate - Continue omeprazole for now - Continue antireflux lifestyle/dietary modifications  The indications, risks, and benefits of EGD were explained to the patient in detail. Risks include but  are not limited to bleeding, perforation, adverse reaction to medications, and cardiopulmonary compromise. Sequelae include but are not limited to the possibility of surgery, hospitalization, and mortality. The patient verbalized understanding and wished to proceed. All questions answered, referred to scheduler. Further recommendations pending results of the exam.     Lavena Bullion, DO, FACG  03/31/2021, 10:21 AM   Ronnald Nian, DO

## 2021-03-31 NOTE — Patient Instructions (Signed)
If you are age 29 or older, your body mass index should be between 23-30. Your Body mass index is 25.94 kg/m. If this is out of the aforementioned range listed, please consider follow up with your Primary Care Provider.  If you are age 30 or younger, your body mass index should be between 19-25. Your Body mass index is 25.94 kg/m. If this is out of the aformentioned range listed, please consider follow up with your Primary Care Provider.   __________________________________________________________  The [facility] providers would like to encourage you to use [facility] to communicate with providers for non-urgent requests or questions.  Due to long hold times on the telephone, sending your provider a message by [facility] may be a faster and more efficient way to get a response.  Please allow 48 business hours for a response.  Please remember that this is for non-urgent requests.   You have been scheduled for an endoscopy. Please follow written instructions given to you at your visit today. If you use inhalers (even only as needed), please bring them with you on the day of your procedure.  It was a pleasure to see you today!  Vito Cirigliano, D.O.

## 2021-04-02 ENCOUNTER — Other Ambulatory Visit (HOSPITAL_COMMUNITY): Payer: Self-pay

## 2021-04-02 MED FILL — Omeprazole Cap Delayed Release 20 MG: ORAL | 30 days supply | Qty: 30 | Fill #4 | Status: AC

## 2021-04-22 ENCOUNTER — Encounter: Payer: Self-pay | Admitting: Gastroenterology

## 2021-04-22 ENCOUNTER — Ambulatory Visit (AMBULATORY_SURGERY_CENTER): Payer: No Typology Code available for payment source | Admitting: Gastroenterology

## 2021-04-22 ENCOUNTER — Other Ambulatory Visit: Payer: Self-pay

## 2021-04-22 VITALS — BP 122/82 | HR 79 | Temp 99.5°F | Resp 12 | Ht 74.0 in | Wt 202.0 lb

## 2021-04-22 DIAGNOSIS — R140 Abdominal distension (gaseous): Secondary | ICD-10-CM | POA: Diagnosis not present

## 2021-04-22 DIAGNOSIS — K219 Gastro-esophageal reflux disease without esophagitis: Secondary | ICD-10-CM

## 2021-04-22 DIAGNOSIS — R1310 Dysphagia, unspecified: Secondary | ICD-10-CM

## 2021-04-22 DIAGNOSIS — R1012 Left upper quadrant pain: Secondary | ICD-10-CM

## 2021-04-22 DIAGNOSIS — R12 Heartburn: Secondary | ICD-10-CM

## 2021-04-22 DIAGNOSIS — K319 Disease of stomach and duodenum, unspecified: Secondary | ICD-10-CM | POA: Diagnosis not present

## 2021-04-22 DIAGNOSIS — R1013 Epigastric pain: Secondary | ICD-10-CM | POA: Diagnosis not present

## 2021-04-22 MED ORDER — SODIUM CHLORIDE 0.9 % IV SOLN: 500.0000 mL | INTRAVENOUS | Status: DC

## 2021-04-22 NOTE — Progress Notes (Signed)
No problems noted in the recovery room. maw 

## 2021-04-22 NOTE — Patient Instructions (Addendum)
The office will call you to schedule a follow up in the office for 2-3 months, or sooner as needed. You may resume your current medications today. Await biopsy results.  May take 1-3 weeks to receive pathology results. Please call if any questions or concerns.     YOU HAD AN ENDOSCOPIC PROCEDURE TODAY AT [facility]:   Refer to the procedure report that was given to you for any specific questions about what was found during the examination.  If the procedure report does not answer your questions, please call your gastroenterologist to clarify.  If you requested that your care partner not be given the details of your procedure findings, then the procedure report has been included in a sealed envelope for you to review at your convenience later.  YOU SHOULD EXPECT: Some feelings of bloating in the abdomen. Passage of more gas than usual.  Walking can help get rid of the air that was put into your GI tract during the procedure and reduce the bloating. If you had a lower endoscopy (such as a colonoscopy or flexible sigmoidoscopy) you may notice spotting of blood in your stool or on the toilet paper. If you underwent a bowel prep for your procedure, you may not have a normal bowel movement for a few days.  Please Note:  You might notice some irritation and congestion in your nose or some drainage.  This is from the oxygen used during your procedure.  There is no need for concern and it should clear up in a day or so.  SYMPTOMS TO REPORT IMMEDIATELY:  r  Following upper endoscopy (EGD)  Vomiting of blood or coffee ground material  New chest pain or pain under the shoulder blades  Painful or persistently difficult swallowing  New shortness of breath  Fever of 100F or higher  Black, tarry-looking stools  For urgent or emergent issues, a gastroenterologist can be reached at any hour by calling [phone]. Do not use MyChart messaging for urgent concerns.    DIET:  We do  recommend a small meal at first, but then you may proceed to your regular diet.  Drink plenty of fluids but you should avoid alcoholic beverages for 24 hours.  ACTIVITY:  You should plan to take it easy for the rest of today and you should NOT DRIVE or use heavy machinery until tomorrow (because of the sedation medicines used during the test).    FOLLOW UP: Our staff will call the number listed on your records 48-72 hours following your procedure to check on you and address any questions or concerns that you may have regarding the information given to you following your procedure. If we do not reach you, we will leave a message.  We will attempt to reach you two times.  During this call, we will ask if you have developed any symptoms of COVID 19. If you develop any symptoms (ie: fever, flu-like symptoms, shortness of breath, cough etc.) before then, please call [phone].  If you test positive for Covid 19 in the 2 weeks post procedure, please call and report this information to [place].    If any biopsies were taken you will be contacted by phone or by letter within the next 1-3 weeks.  Please call us at [phone] if you have not heard about the biopsies in 3 weeks.    SIGNATURES/CONFIDENTIALITY: You and/or your care partner have signed paperwork which will be entered into your electronic medical record.  These  signatures attest to the fact that that the information above on your After Visit Summary has been reviewed and is understood.  Full responsibility of the confidentiality of this discharge information lies with you and/or your care-partner.

## 2021-04-22 NOTE — Progress Notes (Signed)
Pt Drowsy. VSS. To PACU, report to RN. No anesthetic complications noted.  

## 2021-04-22 NOTE — Progress Notes (Signed)
GASTROENTEROLOGY PROCEDURE H&P NOTE   Primary Care Physician: Haydee Salter, MD    Reason for Procedure:   GERD, Bloating  Plan:    EGD with possible dilation, biopsies  Patient is appropriate for endoscopic procedure(s) in the ambulatory ([facility]) setting.  The nature of the procedure, as well as the risks, benefits, and alternatives were carefully and thoroughly reviewed with the patient. Ample time for discussion and questions allowed. The patient understood, was satisfied, and agreed to proceed.     HPI: David Best is a 30 y.o. male who presents for EGD for evaluation of dysphagia, bloating, dyspepsia, LUQ pain, heartburn, and belching .  Patient was most recently seen in the Gastroenterology Clinic on 03/31/2021 by me.  No interval change in medical history since that appointment. Please refer to that note for full details regarding GI history and clinical presentation.   Past Medical History:  Diagnosis Date   Asthma    as a child   GERD (gastroesophageal reflux disease)     Past Surgical History:  Procedure Laterality Date   INGUINAL HERNIA REPAIR Left     Prior to Admission medications   Medication Sig Start Date End Date Taking? Authorizing Provider  albuterol (VENTOLIN HFA) 108 (90 Base) MCG/ACT inhaler INHALE 2 PUFFS INTO THE LUNGS EVERY 6 HOURS AS NEEDED FOR WHEEZING OR SHORTNESS OF BREATH. 05/13/20 05/13/21  Freddi Starr, MD  fluticasone (FLOVENT HFA) 110 MCG/ACT inhaler Inhale 1 puff into the lungs 2 (two) times daily. 09/06/19   Miette Molenda, Garvin Fila, DO  omeprazole (PRILOSEC) 20 MG capsule TAKE 1 CAPSULE BY MOUTH ONCE A DAY 05/13/20 05/13/21  Freddi Starr, MD    Current Outpatient Medications  Medication Sig Dispense Refill   albuterol (VENTOLIN HFA) 108 (90 Base) MCG/ACT inhaler INHALE 2 PUFFS INTO THE LUNGS EVERY 6 HOURS AS NEEDED FOR WHEEZING OR SHORTNESS OF BREATH. 18 g 6   fluticasone (FLOVENT HFA) 110 MCG/ACT inhaler Inhale 1 puff  into the lungs 2 (two) times daily. 1 Inhaler 1   omeprazole (PRILOSEC) 20 MG capsule TAKE 1 CAPSULE BY MOUTH ONCE A DAY 30 capsule 11   Current Facility-Administered Medications  Medication Dose Route Frequency Provider Last Rate Last Admin   0.9 %  sodium chloride infusion  500 mL Intravenous Continuous Jorja Empie V, DO        Allergies as of 04/22/2021   (No Known Allergies)    Family History  Problem Relation Age of Onset   Hypertension Father    Diabetes Paternal Grandmother    Colon polyps Paternal Grandmother    Transient ischemic attack Paternal Grandfather    Pancreatic cancer Paternal Grandfather    Colon cancer Neg Hx    Esophageal cancer Neg Hx    Liver cancer Neg Hx    Liver disease Neg Hx     Social History   Socioeconomic History   Marital status: Married    Spouse name: Not on file   Number of children: Not on file   Years of education: Not on file   Highest education level: Not on file  Occupational History   Not on file  Tobacco Use   Smoking status: Never   Smokeless tobacco: Never  Vaping Use   Vaping Use: Never used  Substance and Sexual Activity   Alcohol use: Yes    Comment: 1-2 beer weekly   Drug use: Never   Sexual activity: Not on file  Other Topics Concern  Not on file  Social History Narrative   Not on file   Social Determinants of Health   Financial Resource Strain: Not on file  Food Insecurity: Not on file  Transportation Needs: Not on file  Physical Activity: Not on file  Stress: Not on file  Social Connections: Not on file  Intimate Partner Violence: Not on file    Physical Exam: Vital signs in last 24 hours: @BP  127/79   Pulse 94   Temp 99.5 F (37.5 C) (Temporal)   Ht 6\' 2"  (1.88 m)   Wt 202 lb (91.6 kg)   SpO2 95%   BMI 25.94 kg/m  GEN: NAD EYE: Sclerae anicteric ENT: MMM CV: Non-tachycardic Pulm: CTA b/l GI: Soft, NT/ND NEURO:  Alert & Oriented x 3   Gerrit Heck, DO [facility]   04/22/2021 9:23 AM

## 2021-04-22 NOTE — Progress Notes (Signed)
Pt's states no medical or surgical changes since previsit or office visit. 

## 2021-04-22 NOTE — Progress Notes (Signed)
Lidocaine 100mg IV given to blunt gag reflex 

## 2021-04-22 NOTE — Op Note (Signed)
[place] Patient Name: David Best Procedure Date: 04/22/2021 9:22 AM MRN: [id number] Endoscopist: Gerrit Heck , MD Age: 30 Referring MD:  Date of Birth: 21-Sep-1990 Gender: Male Account #: [id number] Procedure:                Upper GI endoscopy Indications:              Abdominal discomfort in the left upper quadrant,                            Dysphagia, Heartburn, Suspected esophageal reflux,                            Chronic cough, Dyspepsia, Eructation Medicines:                Monitored Anesthesia Care Procedure:                Pre-Anesthesia Assessment:                           - Prior to the procedure, a History and Physical                            was performed, and patient medications and                            allergies were reviewed. The patient's tolerance of                            previous anesthesia was also reviewed. The risks                            and benefits of the procedure and the sedation                            options and risks were discussed with the patient.                            All questions were answered, and informed consent                            was obtained. Prior Anticoagulants: The patient has                            taken no previous anticoagulant or antiplatelet                            agents. ASA Grade Assessment: II - A patient with                            mild systemic disease. After reviewing the risks                            and benefits, the patient was deemed in  satisfactory condition to undergo the procedure.                           After obtaining informed consent, the endoscope was                            passed under direct vision. Throughout the                            procedure, the patient's blood pressure, pulse, and                            oxygen saturations were monitored continuously. The                            Endoscope was  introduced through the mouth, and                            advanced to the second part of duodenum. The upper                            GI endoscopy was accomplished without difficulty.                            The patient tolerated the procedure well. Scope In: Scope Out: Findings:                 The examined esophagus was normal. Biopsies were                            obtained from the proximal and distal esophagus                            with cold forceps to rule out eosinophilic                            esophagitis. Estimated blood loss was minimal.                           The Z-line was regular and was found 40 cm from the                            incisors.                           The entire examined stomach was normal. Pylorus was                            widely patent and easily traversed. Biopsies were                            taken with a cold forceps for Helicobacter pylori  testing. Estimated blood loss was minimal.                           The examined duodenum was normal. Biopsies were                            taken with a cold forceps for histology. Estimated                            blood loss was minimal. Complications:            No immediate complications. Estimated Blood Loss:     Estimated blood loss was minimal. Impression:               - Normal esophagus. Biopsied.                           - Z-line regular, 40 cm from the incisors.                           - Normal stomach. Biopsied.                           - Normal examined duodenum. Biopsied. Recommendation:           - Patient has a contact number available for                            emergencies. The signs and symptoms of potential                            delayed complications were discussed with the                            patient. Return to normal activities tomorrow.                            Written discharge instructions were provided to  the                            patient.                           - Resume previous diet.                           - Continue present medications.                           - Await pathology results.                           - Return to GI clinic in 2-3 months, or sooner as                            needed.                           -  If concern for continued breakthrough reflux                            symptoms, will plan for Esophageal Manometry with                            pH/Impedance testing. Gerrit Heck, MD 04/22/2021 9:46:17 AM

## 2021-04-22 NOTE — Progress Notes (Signed)
Called to room to assist during endoscopic procedure.  Patient ID and intended procedure confirmed with present staff. Received instructions for my participation in the procedure from the performing physician.  

## 2021-04-23 ENCOUNTER — Telehealth: Payer: Self-pay

## 2021-04-23 NOTE — Telephone Encounter (Signed)
Per 04/22/21 procedure report - Return to GI clinic in 2-3 months, or sooner as needed  Patient has been scheduled for a 66-month follow up with Dr. Bryan Lemma on Tuesday, 06/30/21 at 9:40 PM. Appt information sent to patient via my chart and a letter.

## 2021-04-24 ENCOUNTER — Telehealth: Payer: Self-pay | Admitting: *Deleted

## 2021-04-24 NOTE — Telephone Encounter (Signed)
  Follow up Call-  Call back number 04/22/2021  Post procedure Call Back phone  # [phone]  Permission to leave phone message Yes  Some recent data might be hidden     Patient questions:  Do you have a fever, pain , or abdominal swelling? No. Pain Score  0 *  Have you tolerated food without any problems? Yes.  Have you been able to return to your normal activities? Yes.  Do you have any questions about your discharge instructions: Diet   No. Medications  No. Follow up visit  No.  Do you have questions or concerns about your Care? No.  Actions: * If pain score is 4 or above: No action needed, pain <4.  Have you developed a fever since your procedure? no  2.   Have you had an respiratory symptoms (SOB or cough) since your procedure? no  3.   Have you tested positive for COVID 19 since your procedure no  4.   Have you had any family members/close contacts diagnosed with the COVID 19 since your procedure?  no   If yes to any of these questions please route to Joylene John, RN and Joella Prince, RN

## 2021-05-13 ENCOUNTER — Other Ambulatory Visit: Payer: Self-pay

## 2021-05-13 MED ORDER — FLUARIX QUADRIVALENT 0.5 ML IM SUSY
PREFILLED_SYRINGE | INTRAMUSCULAR | 0 refills | Status: DC
  Filled 2021-05-13: qty 0.5, 1d supply, fill #0

## 2021-05-15 ENCOUNTER — Encounter: Payer: No Typology Code available for payment source | Admitting: Gastroenterology

## 2021-05-21 ENCOUNTER — Other Ambulatory Visit: Payer: Self-pay

## 2021-05-22 ENCOUNTER — Encounter: Payer: Self-pay | Admitting: Family Medicine

## 2021-05-22 ENCOUNTER — Other Ambulatory Visit (HOSPITAL_COMMUNITY): Payer: Self-pay

## 2021-05-22 ENCOUNTER — Ambulatory Visit (INDEPENDENT_AMBULATORY_CARE_PROVIDER_SITE_OTHER): Payer: No Typology Code available for payment source | Admitting: Family Medicine

## 2021-05-22 VITALS — BP 138/82 | HR 96 | Temp 97.5°F | Ht 73.0 in | Wt 200.0 lb

## 2021-05-22 DIAGNOSIS — E559 Vitamin D deficiency, unspecified: Secondary | ICD-10-CM | POA: Diagnosis not present

## 2021-05-22 DIAGNOSIS — F411 Generalized anxiety disorder: Secondary | ICD-10-CM | POA: Diagnosis not present

## 2021-05-22 DIAGNOSIS — J4530 Mild persistent asthma, uncomplicated: Secondary | ICD-10-CM | POA: Insufficient documentation

## 2021-05-22 DIAGNOSIS — E785 Hyperlipidemia, unspecified: Secondary | ICD-10-CM | POA: Diagnosis not present

## 2021-05-22 DIAGNOSIS — K219 Gastro-esophageal reflux disease without esophagitis: Secondary | ICD-10-CM

## 2021-05-22 MED ORDER — VENLAFAXINE HCL ER 37.5 MG PO CP24
37.5000 mg | ORAL_CAPSULE | Freq: Every day | ORAL | 3 refills | Status: DC
  Filled 2021-05-22: qty 30, 30d supply, fill #0
  Filled 2021-06-23: qty 30, 30d supply, fill #1

## 2021-05-22 MED ORDER — OMEPRAZOLE 20 MG PO CPDR
DELAYED_RELEASE_CAPSULE | Freq: Every day | ORAL | 11 refills | Status: DC
  Filled 2021-05-22: qty 30, 30d supply, fill #0
  Filled 2021-08-05: qty 30, 30d supply, fill #1
  Filled 2021-09-22: qty 30, 30d supply, fill #2
  Filled 2021-12-07: qty 30, 30d supply, fill #3
  Filled 2022-02-10: qty 30, 30d supply, fill #4

## 2021-05-22 NOTE — Progress Notes (Signed)
[facility] [address] [place] [address] Dept: [phone] Dept Fax: [phone]  Transfer of Care Office Visit  Subjective:    Patient ID: David Best, male    DOB: [birth year], 30 y.o..   MRN: [id number]  Chief Complaint  Patient presents with   Transitions Of Care    TOC from Dr. Cathleen Corti, patient would like to discuss elevated LDL levels and issues with anxiety.     History of Present Illness:  Patient is in today to establish care. David Best was born in [place]. 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 He has been married for 5 years. They have no kids. He denies tobacco or drug use. He drinks 1-2 cans of beer on some weekends.  David Best has a history of GERD. He underwent an EGD on (/28 which was normal. Prior to starting of omeprazole, he was having symptoms of asthma and had been placed on a steroid inhaler and albuterol. Since starting omeprazole, his chest symptoms have completely resolved. He has tried to go off the omeprazole, but redevelops symptoms over a period of weeks.  David Best has a past history of borderline elevation of his lipids and a Vitamin D deficiency, neither of which are treated medically.  David Best notes a concern for anxiety. 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 Since the onset of the COVID pandemic, he has noted increasing issues with a general feeling of anxiousness. He describes this starting out his day as he approaches work. However, he also notes this in social settings. He finds it increasingly difficult to talk himself out of this.  His wife had noted some of this and had encouraged hm to bring this up during this appointment.  Past Medical History: Patient Active Problem List   Diagnosis Date Noted   GERD (gastroesophageal reflux disease) 05/22/2021   Borderline  hyperlipidemia 05/22/2021   Vitamin D deficiency 05/22/2021   Generalized anxiety disorder 05/22/2021   Past Surgical History:  Procedure Laterality Date   INGUINAL HERNIA REPAIR Left    Family History  Problem Relation Age of Onset   Hypertension Father    Cancer Maternal Aunt        Lung   Cancer Maternal Aunt        Lung   Diabetes Paternal Uncle    Anxiety disorder Paternal Grandmother    Diabetes Paternal Grandmother    Colon polyps Paternal Grandmother    Heart disease Paternal Grandfather    Transient ischemic attack Paternal Grandfather    Pancreatic cancer Paternal Grandfather    Colon cancer Neg Hx    Esophageal cancer Neg Hx    Liver cancer Neg Hx    Liver disease Neg Hx    Outpatient Medications Prior to Visit  Medication Sig Dispense Refill   albuterol (VENTOLIN HFA) 108 (90 Base) MCG/ACT inhaler INHALE 2 PUFFS INTO THE LUNGS EVERY 6 HOURS AS NEEDED FOR WHEEZING OR SHORTNESS OF BREATH. 18 g 6   omeprazole (PRILOSEC) 20 MG capsule TAKE 1 CAPSULE BY MOUTH ONCE A DAY 30 capsule 11   fluticasone (FLOVENT HFA) 110 MCG/ACT inhaler Inhale 1 puff into the lungs 2 (two) times daily. (Patient not taking: Reported on 05/22/2021) 1 Inhaler 1   influenza vac split quadrivalent PF (FLUARIX QUADRIVALENT) 0.5 ML injection Inject into the muscle. 0.5 mL 0   No  facility-administered medications prior to visit.   No Known Allergies    Objective:   Today's Vitals   05/22/21 1304  BP: 138/82  Pulse: 96  Temp: (!) 97.5 F (36.4 C)  TempSrc: Temporal  SpO2: 99%  Weight: 200 lb (90.7 kg)  Height: 6\' 1"  (1.854 m)   Body mass index is 26.39 kg/m.   General: Well developed, well nourished. No acute distress. Psych: Alert and oriented. Normal mood and affect.  Health Maintenance Due  Topic Date Due   Pneumococcal Vaccine 67-34 Years old (1 - PCV) Never done   HIV Screening  Never done   Hepatitis C Screening  Never done   [place] Results: Lab Results  Component Value  Date   CHOL 209 (H) 03/21/2020   HDL 48.50 03/21/2020   LDLCALC 146 (H) 03/21/2020   TRIG 70.0 03/21/2020   CHOLHDL 4 03/21/2020   BMP Latest Ref Rng & Units 03/21/2020 02/28/2019 01/05/2018  Glucose 70 - 99 mg/dL 98 93 86  BUN 6 - 23 mg/dL 15 10 13   Creatinine 0.40 - 1.50 mg/dL 0.98 0.92 1.03  Sodium 135 - 145 mEq/L 139 137 139  Potassium 3.5 - 5.1 mEq/L 4.1 4.1 3.8  Chloride 96 - 112 mEq/L 102 102 102  CO2 19 - 32 mEq/L 28 27 28   Calcium 8.4 - 10.5 mg/dL 9.9 9.7 9.9   Lab Results  Component Value Date   TSH 3.44 10/23/2019   Component Ref Range & Units 1 yr ago   VITD 30.00 - 100.00 ng/mL 22.33 Low     Depression screen [facility] 2/9 05/22/2021 05/22/2021 03/21/2020  Decreased Interest 0 0 0  Down, Depressed, Hopeless 0 0 0  PHQ - 2 Score 0 0 0  Altered sleeping 1 - -  Tired, decreased energy 1 - -  Change in appetite 0 - -  Feeling bad or failure about yourself  0 - -  Trouble concentrating 0 - -  Moving slowly or fidgety/restless 1 - -  Suicidal thoughts 0 - -  PHQ-9 Score 3 - -  Difficult doing work/chores Somewhat difficult - -   GAD 7 : Generalized Anxiety Score 05/22/2021  Nervous, Anxious, on Edge 3  Control/stop worrying 2  Worry too much - different things 1  Trouble relaxing 1  Restless 1  Easily annoyed or irritable 0  Afraid - awful might happen 2  Total GAD 7 Score 10  Anxiety Difficulty Somewhat difficult    Assessment & Plan:   1. Generalized anxiety disorder David Best has had 2+ years of increasing issues with anxiety. His GAD7 is consistent with moderate issues. We discussed starting a low dose SNRI. I will want to reassess him in 6 weeks to see if he is having improvement. In the meantime, we discussed getting adequate sleep, eating a balanced diet, and exercising.  - venlafaxine XR (EFFEXOR XR) 37.5 MG 24 hr capsule; Take 1 capsule (37.5 mg total) by mouth daily with breakfast.  Dispense: 30 capsule; Refill: 3  2. Gastroesophageal reflux disease  without esophagitis Stable on omeprazole.  - omeprazole (PRILOSEC) 20 MG capsule; TAKE 1 CAPSULE BY MOUTH ONCE A DAY  Dispense: 30 capsule; Refill: 11  3. Borderline hyperlipidemia I recommended eating a heart-healthy diet (DASH diet or Mediterranean), getting 150 minutes of moderate-intensity exercise each week, maintaining a normal weight, and avoiding tobacco products. We will check fasting labs at his next visit.  4. Vitamin D deficiency I will reassess his Vitamin D  at the next visit. If this remains low, we wills tart him on a replenishment dose of Vitamin D.  Haydee Salter, MD

## 2021-06-23 ENCOUNTER — Other Ambulatory Visit (HOSPITAL_COMMUNITY): Payer: Self-pay

## 2021-06-30 ENCOUNTER — Other Ambulatory Visit: Payer: Self-pay

## 2021-06-30 ENCOUNTER — Ambulatory Visit (INDEPENDENT_AMBULATORY_CARE_PROVIDER_SITE_OTHER): Payer: No Typology Code available for payment source | Admitting: Gastroenterology

## 2021-06-30 ENCOUNTER — Encounter: Payer: Self-pay | Admitting: Gastroenterology

## 2021-06-30 VITALS — BP 122/88 | HR 96 | Ht 73.0 in | Wt 200.0 lb

## 2021-06-30 DIAGNOSIS — K219 Gastro-esophageal reflux disease without esophagitis: Secondary | ICD-10-CM | POA: Diagnosis not present

## 2021-06-30 DIAGNOSIS — Z7189 Other specified counseling: Secondary | ICD-10-CM | POA: Diagnosis not present

## 2021-06-30 NOTE — Progress Notes (Signed)
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  Has had sxs for a few years.  Symptoms well controlled since starting omeprazole 20 mg/day.  Does tend to have breakthrough with any missed doses.  2) Abdominal pain.  Dyspepsia/LUQ pain described as dull ache/discomfort rather than frank pain.  Tends to improve with eating.  Good appetite and no nausea/vomiting.  PGF s/p Whipple Surgery for Pancreatic NET.   Endoscopic History: - EGD (03/2021): Normal   HPI:     Patient is a 30 y.o. male presenting to the Gastroenterology Clinic for follow-up.  Initially seen by me on 03/31/2021 for evaluation of reflux symptoms and abdominal discomfort as outlined above.  Reflux symptoms well controlled on omeprazole 20 mg/day.  Tried stopping omeprazole, with breakthrough symptoms at about a 3.  Again well controlled since restarting. Abdominal discomfort has resolved with PPI and after starting venlafaxine.    Will occasionally have left mid abdominal "rumbling" without frank pain.  This tends to occur if he has any breakthrough reflux symptoms.  Also more likely with times of stress, anxiety.  No associated changes in bowel habits, hematochezia, etc.  Otherwise, no new labs or abdominal imaging for review since last appointment.  Review of systems:     No chest pain, no SOB, no fevers, no urinary sx   Past Medical History:  Diagnosis Date   Asthma    as a child   GERD (gastroesophageal reflux disease)     Patient's surgical history, family medical history, social history, medications and allergies were all reviewed in Epic    Current Outpatient Medications  Medication Sig Dispense Refill   albuterol (VENTOLIN HFA) 108 (90 Base) MCG/ACT inhaler INHALE 2 PUFFS INTO THE LUNGS EVERY 6 HOURS AS NEEDED FOR WHEEZING OR SHORTNESS OF BREATH. 18  g 6   omeprazole (PRILOSEC) 20 MG capsule TAKE 1 CAPSULE BY MOUTH ONCE A DAY 30 capsule 11   venlafaxine XR (EFFEXOR XR) 37.5 MG 24 hr capsule Take 1 capsule (37.5 mg total) by mouth daily with breakfast. 30 capsule 3   No current facility-administered medications for this visit.    Physical Exam:     Ht 6\' 1"  (1.854 m)   Wt 200 lb (90.7 kg)   BMI 26.39 kg/m   GENERAL:  Pleasant male in NAD PSYCH: : Cooperative, normal affect Musculoskeletal:  Normal muscle tone, normal strength NEURO: Alert and oriented x 3, no focal neurologic deficits   IMPRESSION and PLAN:    1) GERD without erosive esophagitis - Well-controlled with omeprazole 20 mg/day - Continue current therapy - Continue antireflux lifestyle/dietary modifications with avoidance of exacerbating foods - Annual micronutrient evaluation and BMP check with chronic PPI  2) Dyspepsia - Resolved with omeprazole  3) Abdominal discomfort - Stress related - Improved with venlafaxine - Discussed role/utility of Bentyl, Symax, Levsin, etc.  Given the self-limiting and minimally bothersome nature, decided to hold off on trial of new agent  4) Medication counseling - I have reviewed the indications, risks, and benefits of PPI therapy with the patient today. I have discussed studies that raise ? of increased osteoporosis, dementia, and kidney failure and explained that these studies show very weak associations of unclear significance and not clear cause and effect. We did discuss the potential for vitamin malabsorption, to include magnesium (  very rare), calcium (easily modifiable with Calcium Citrate supplement), vitamin B12 (again, correctable with oral B12 supplement), and iron (although rarely clinically significant outside patients who require iron supplementation previously), and can monitor each of these periodically with routine labs. We have agreed to continue PPI treatment in this case. 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

## 2021-06-30 NOTE — Patient Instructions (Signed)
If you are age 29 or older, your body mass index should be between 23-30. Your Body mass index is 26.39 kg/m. If this is out of the aforementioned range listed, please consider follow up with your Primary Care Provider.  If you are age 42 or younger, your body mass index should be between 19-25. Your Body mass index is 26.39 kg/m. If this is out of the aformentioned range listed, please consider follow up with your Primary Care Provider.    Follow up as needed.   Thank you for choosing me and [facility].  Vito Cirigliano, D.O.

## 2021-07-06 ENCOUNTER — Other Ambulatory Visit: Payer: Self-pay

## 2021-07-06 ENCOUNTER — Encounter: Payer: Self-pay | Admitting: Family Medicine

## 2021-07-06 ENCOUNTER — Other Ambulatory Visit (HOSPITAL_COMMUNITY): Payer: Self-pay

## 2021-07-06 ENCOUNTER — Ambulatory Visit (INDEPENDENT_AMBULATORY_CARE_PROVIDER_SITE_OTHER): Payer: No Typology Code available for payment source | Admitting: Family Medicine

## 2021-07-06 VITALS — BP 130/90 | HR 106 | Temp 96.9°F | Ht 73.0 in | Wt 201.8 lb

## 2021-07-06 DIAGNOSIS — E559 Vitamin D deficiency, unspecified: Secondary | ICD-10-CM

## 2021-07-06 DIAGNOSIS — F411 Generalized anxiety disorder: Secondary | ICD-10-CM

## 2021-07-06 DIAGNOSIS — E785 Hyperlipidemia, unspecified: Secondary | ICD-10-CM

## 2021-07-06 LAB — LIPID PANEL
Cholesterol: 245 mg/dL — ABNORMAL HIGH (ref 0–200)
HDL: 53.2 mg/dL (ref >39.00)
LDL Cholesterol: 173 mg/dL — ABNORMAL HIGH (ref 0–99)
NonHDL: 191.34
Total CHOL/HDL Ratio: 5
Triglycerides: 93 mg/dL (ref 0.0–149.0)
VLDL: 18.6 mg/dL (ref 0.0–40.0)

## 2021-07-06 LAB — VITAMIN D 25 HYDROXY (VIT D DEFICIENCY, FRACTURES): VITD: 18.8 ng/mL — ABNORMAL LOW (ref 30.00–100.00)

## 2021-07-06 MED ORDER — VITAMIN D (ERGOCALCIFEROL) 1.25 MG (50000 UNIT) PO CAPS
50000.0000 [IU] | ORAL_CAPSULE | ORAL | 1 refills | Status: DC
  Filled 2021-07-06: qty 10, 70d supply, fill #0
  Filled 2021-07-06: qty 4, 28d supply, fill #0

## 2021-07-06 MED ORDER — HYDROXYZINE HCL 10 MG PO TABS
10.0000 mg | ORAL_TABLET | Freq: Three times a day (TID) | ORAL | 2 refills | Status: AC | PRN
Start: 2021-07-06 — End: ?
  Filled 2021-07-06 (×2): qty 30, 10d supply, fill #0

## 2021-07-06 MED ORDER — VENLAFAXINE HCL ER 75 MG PO CP24
75.0000 mg | ORAL_CAPSULE | Freq: Every day | ORAL | 3 refills | Status: DC
  Filled 2021-07-06 (×2): qty 30, 30d supply, fill #0
  Filled 2021-08-05: qty 30, 30d supply, fill #1
  Filled 2021-09-05: qty 30, 30d supply, fill #2
  Filled 2021-10-09: qty 30, 30d supply, fill #3

## 2021-07-06 NOTE — Progress Notes (Signed)
[facility] [address] [place] [address] Dept: [phone] Dept Fax: [phone]  Chronic Care Office Visit  Subjective:    Patient ID: David Best, male    DOB: [birth year], 30 y.o..   MRN: [id number]  Chief Complaint  Patient presents with   Follow-up    6 wk f/u. Fasting labs    History of Present Illness:  Patient is in today for reassessment of chronic medical issues.  At our initial visit in Oct., [name] was diagnosed with generalized anxiety. I started him on venlafaxine. He notes that in the first two weeks, he had some mental fogging. By the third week, he felt his energy level was improved, but had a couple of brief panic attacks. Since then he has felt improved, but wonders if he may need a higher dose. He did also take a 10 mg hydroxyzine his wife had and felt this helped with his anxiety/sleep.  [name] has a history of borderline hyperlipidemia and Vitamin D deficiency.  Past Medical History: Patient Active Problem List   Diagnosis Date Noted   GERD (gastroesophageal reflux disease) 05/22/2021   Borderline hyperlipidemia 05/22/2021   Vitamin D deficiency 05/22/2021   Generalized anxiety disorder 05/22/2021   Past Surgical History:  Procedure Laterality Date   INGUINAL HERNIA REPAIR Left    Family History  Problem Relation Age of Onset   Hypertension Father    Cancer Maternal Aunt        Lung   Cancer Maternal Aunt        Lung   Diabetes Paternal Uncle    Anxiety disorder Paternal Grandmother    Diabetes Paternal Grandmother    Colon polyps Paternal Grandmother    Heart disease Paternal Grandfather    Transient ischemic attack Paternal Grandfather    Pancreatic cancer Paternal Grandfather    Colon cancer Neg Hx    Esophageal cancer Neg Hx    Liver cancer Neg Hx    Liver disease Neg Hx    Outpatient Medications Prior to Visit  Medication Sig Dispense Refill    omeprazole (PRILOSEC) 20 MG capsule TAKE 1 CAPSULE BY MOUTH ONCE A DAY 30 capsule 11   albuterol (VENTOLIN HFA) 108 (90 Base) MCG/ACT inhaler INHALE 2 PUFFS INTO THE LUNGS EVERY 6 HOURS AS NEEDED FOR WHEEZING OR SHORTNESS OF BREATH. 18 g 6   venlafaxine XR (EFFEXOR XR) 37.5 MG 24 hr capsule Take 1 capsule (37.5 mg total) by mouth daily with breakfast. 30 capsule 3   No facility-administered medications prior to visit.   No Known Allergies    Objective:   Today's Vitals   07/06/21 0827  BP: 130/90  Pulse: (!) 106  Temp: (!) 96.9 F (36.1 C)  TempSrc: Temporal  SpO2: 98%  Weight: 201 lb 12.8 oz (91.5 kg)  Height: 6\' 1"  (1.854 m)   Body mass index is 26.62 kg/m.   General: Well developed, well nourished. No acute distress. Psych: Alert and oriented. Normal mood and affect.  Health Maintenance Due  Topic Date Due   HIV Screening  Never done   Hepatitis C Screening  Never done        Assessment & Plan:   1. Generalized anxiety disorder [name]'s GAD-7 is mildly decreased from previous. I will titrate up on his venlafaxine dose to 75 mg and plan to see him back in 6 weeks to assess. I am also fine with him taking  hydroxyzine as needed for panic or sleep.  - hydrOXYzine (ATARAX) 10 MG tablet; Take 1 tablet (10 mg total) by mouth 3 (three) times daily as needed.  Dispense: 30 tablet; Refill: 2 - venlafaxine XR (EFFEXOR XR) 75 MG 24 hr capsule; Take 1 capsule (75 mg total) by mouth daily with breakfast.  Dispense: 30 capsule; Refill: 3  2. Vitamin D deficiency Due to reassess his Vitamin D level today.  - VITAMIN D 25 Hydroxy (Vit-D Deficiency, Fractures)  3. Borderline hyperlipidemia Due to reassess lipids.  - Lipid panel  Haydee Salter, MD

## 2021-07-06 NOTE — Addendum Note (Signed)
Addended by: Haydee Salter on: 07/06/2021 04:45 PM   Modules accepted: Orders

## 2021-07-07 ENCOUNTER — Encounter: Payer: Self-pay | Admitting: Family Medicine

## 2021-07-07 DIAGNOSIS — E782 Mixed hyperlipidemia: Secondary | ICD-10-CM

## 2021-07-08 NOTE — Addendum Note (Signed)
Addended by: Haydee Salter on: 07/08/2021 05:52 PM   Modules accepted: Orders

## 2021-08-05 ENCOUNTER — Other Ambulatory Visit (HOSPITAL_COMMUNITY): Payer: Self-pay

## 2021-08-14 ENCOUNTER — Other Ambulatory Visit: Payer: Self-pay

## 2021-08-17 ENCOUNTER — Ambulatory Visit (INDEPENDENT_AMBULATORY_CARE_PROVIDER_SITE_OTHER): Payer: No Typology Code available for payment source | Admitting: Family Medicine

## 2021-08-17 ENCOUNTER — Encounter: Payer: Self-pay | Admitting: Family Medicine

## 2021-08-17 ENCOUNTER — Other Ambulatory Visit: Payer: Self-pay

## 2021-08-17 VITALS — BP 130/86 | HR 95 | Temp 97.4°F | Ht 73.0 in | Wt 198.8 lb

## 2021-08-17 DIAGNOSIS — F411 Generalized anxiety disorder: Secondary | ICD-10-CM | POA: Diagnosis not present

## 2021-08-17 DIAGNOSIS — R030 Elevated blood-pressure reading, without diagnosis of hypertension: Secondary | ICD-10-CM | POA: Insufficient documentation

## 2021-08-17 DIAGNOSIS — E782 Mixed hyperlipidemia: Secondary | ICD-10-CM | POA: Diagnosis not present

## 2021-08-17 NOTE — Progress Notes (Signed)
[facility] [address] [place] [address] Dept: [phone] Dept Fax: [phone]  Chronic Care Office Visit  Subjective:    Patient ID: David Best, male    DOB: Jun 17, 1991, 31 y.o..   MRN: [id number]  Chief Complaint  Patient presents with   Follow-up    6 week f/u meds.  No concerns.      History of Present Illness:  Patient is in today for reassessment of chronic medical issues.  Mr. David Best has a history of generalized anxiety. I started him on venlafaxine this Fall. We titrated his dose up and added hydroxyzine at his last appointment. He feels he is doing better at this point. He is no longer having anxiety related to going to work. He does notes some specific issues of anxiety in crowded restaurants.    Mr. David Best has a history of hyperlipidemia. He notes since his last lipids, he and his wife are making some dietary changes (adding vegetarian meals, reducing fast foods, etc.).  Past Medical History: Patient Active Problem List   Diagnosis Date Noted   GERD (gastroesophageal reflux disease) 05/22/2021   Hyperlipidemia 05/22/2021   Vitamin D deficiency 05/22/2021   Generalized anxiety disorder 05/22/2021   Past Surgical History:  Procedure Laterality Date   INGUINAL HERNIA REPAIR Left    Family History  Problem Relation Age of Onset   Hypertension Father    Cancer Maternal Aunt        Lung   Cancer Maternal Aunt        Lung   Diabetes Paternal Uncle    Anxiety disorder Paternal Grandmother    Diabetes Paternal Grandmother    Colon polyps Paternal Grandmother    Heart disease Paternal Grandfather    Transient ischemic attack Paternal Grandfather    Pancreatic cancer Paternal Grandfather    Colon cancer Neg Hx    Esophageal cancer Neg Hx    Liver cancer Neg Hx    Liver disease Neg Hx     Outpatient Medications Prior to Visit  Medication Sig Dispense Refill   hydrOXYzine  (ATARAX) 10 MG tablet Take 1 tablet (10 mg total) by mouth 3 (three) times daily as needed. 30 tablet 2   omeprazole (PRILOSEC) 20 MG capsule TAKE 1 CAPSULE BY MOUTH ONCE A DAY 30 capsule 11   venlafaxine XR (EFFEXOR XR) 75 MG 24 hr capsule Take 1 capsule (75 mg total) by mouth daily with breakfast. 30 capsule 3   Vitamin D, Ergocalciferol, (DRISDOL) 1.25 MG (50000 UNIT) CAPS capsule Take 1 capsule (50,000 Units total) by mouth every 7 (seven) days. 5 capsule 1   No facility-administered medications prior to visit.   No Known Allergies    Objective:   Today's Vitals   08/17/21 1425 08/17/21 1427  BP: (!) 150/86 130/86  Pulse: 95   Temp: (!) 97.4 F (36.3 C)   TempSrc: Temporal   SpO2: 98%   Weight: 198 lb 12.8 oz (90.2 kg)   Height: 6\' 1"  (1.854 m)    Body mass index is 26.23 kg/m.   General: Well developed, well nourished. No acute distress. Psych: Alert and oriented. Normal mood and affect.  Health Maintenance Due  Topic Date Due   HIV Screening  Never done   Hepatitis C Screening  Never done   Lab Results Last lipids Lab Results  Component Value Date   CHOL 245 (H) 07/06/2021   HDL 53.20 07/06/2021   [place]  173 (H) 07/06/2021   TRIG 93.0 07/06/2021   CHOLHDL 5 07/06/2021   Assessment & Plan:   1. Generalized anxiety disorder Improved on venlafaxine 75 mg daily and hydroxyzine. Since his anxiety in crowded restaurants is pretty specific (agoraphobia like), I recommend we have him do some counseling around this to see if we might improve this with cognitive restructuring/behavioral approaches.  - Ambulatory referral to Psychology  2. Mixed hyperlipidemia The LDL cholesterol is in a high range. Mr. David Best has a positive family history of strokes, heart disease, and hypertension. I have entered orders for Lp(a), ApoB, and hs-CRP to assess risk enhancers that might warrant consideration for a statin. He will have this drawn today. I do applaud his lifestyle  efforts and encourage him to continue with this.  3. Elevated blood pressure reading in office without diagnosis of hypertension I asked Mr. David Lei Best to check and log his BP outside of the office. We will review this at his next visit.  Haydee Salter, MD

## 2021-08-18 LAB — HIGH SENSITIVITY CRP: CRP, High Sensitivity: 0.9 mg/L (ref 0.000–5.000)

## 2021-08-18 LAB — APOLIPOPROTEIN B: Apolipoprotein B: 127 mg/dL — ABNORMAL HIGH (ref <90)

## 2021-08-19 ENCOUNTER — Other Ambulatory Visit (HOSPITAL_COMMUNITY): Payer: Self-pay

## 2021-08-19 ENCOUNTER — Encounter: Payer: Self-pay | Admitting: Family Medicine

## 2021-08-19 ENCOUNTER — Other Ambulatory Visit: Payer: Self-pay

## 2021-08-19 DIAGNOSIS — E782 Mixed hyperlipidemia: Secondary | ICD-10-CM

## 2021-08-19 LAB — LIPOPROTEIN A (LPA): Lipoprotein (a): 220 nmol/L — ABNORMAL HIGH (ref <75)

## 2021-08-19 MED ORDER — ROSUVASTATIN CALCIUM 5 MG PO TABS
5.0000 mg | ORAL_TABLET | Freq: Every day | ORAL | 3 refills | Status: DC
  Filled 2021-08-19: qty 90, 90d supply, fill #0

## 2021-08-19 MED ORDER — ROSUVASTATIN CALCIUM 5 MG PO TABS
5.0000 mg | ORAL_TABLET | Freq: Every day | ORAL | 3 refills | Status: DC
  Filled 2021-08-19: qty 90, 90d supply, fill #0
  Filled 2021-11-18: qty 90, 90d supply, fill #1
  Filled 2022-02-10: qty 90, 90d supply, fill #2
  Filled 2022-05-13: qty 90, 90d supply, fill #3

## 2021-08-31 ENCOUNTER — Ambulatory Visit (INDEPENDENT_AMBULATORY_CARE_PROVIDER_SITE_OTHER): Payer: No Typology Code available for payment source | Admitting: Dermatology

## 2021-08-31 ENCOUNTER — Encounter: Payer: Self-pay | Admitting: Dermatology

## 2021-08-31 ENCOUNTER — Other Ambulatory Visit: Payer: Self-pay

## 2021-08-31 DIAGNOSIS — Z1283 Encounter for screening for malignant neoplasm of skin: Secondary | ICD-10-CM

## 2021-08-31 DIAGNOSIS — L918 Other hypertrophic disorders of the skin: Secondary | ICD-10-CM

## 2021-08-31 DIAGNOSIS — L814 Other melanin hyperpigmentation: Secondary | ICD-10-CM | POA: Diagnosis not present

## 2021-09-05 ENCOUNTER — Other Ambulatory Visit (HOSPITAL_COMMUNITY): Payer: Self-pay

## 2021-09-19 ENCOUNTER — Encounter: Payer: Self-pay | Admitting: Dermatology

## 2021-09-19 NOTE — Progress Notes (Signed)
° °  Follow-Up Visit   Subjective  David Best is a 31 y.o. male who presents for the following: Annual Exam (Lesions on both arms x years. Lesion in left groin area x all his life. ).  Annual skin check, several spots to look at Location:  Duration:  Quality:  Associated Signs/Symptoms: Modifying Factors:  Severity:  Timing: Context:   Objective  Well appearing patient in no apparent distress; mood and affect are within normal limits. Scalp General skin examination, no atypical pigmented lesions or nonmelanoma skin cancer  Right Antecubital Fossa 4 mm light brown symmetric macule, no dermoscopic atypia  Left Inguinal Area Pedunculated 3 mm papule    A full examination was performed including scalp, head, eyes, ears, nose, lips, neck, chest, axillae, abdomen, back, buttocks, bilateral upper extremities, bilateral lower extremities, hands, feet, fingers, toes, fingernails, and toenails. All findings within normal limits unless otherwise noted below.   Assessment & Plan    Encounter for screening for malignant neoplasm of skin Scalp  Encouraged self-examination twice annually, continue ultraviolet protection.  Lentigo Right Antecubital Fossa  Leave if stable  Skin tag Left Inguinal Area  May choose future removal.      I, Lavonna Monarch, MD, have reviewed all documentation for this visit.  The documentation on 09/19/21 for the exam, diagnosis, procedures, and orders are all accurate and complete.

## 2021-09-21 ENCOUNTER — Ambulatory Visit (INDEPENDENT_AMBULATORY_CARE_PROVIDER_SITE_OTHER): Payer: No Typology Code available for payment source | Admitting: Psychologist

## 2021-09-21 DIAGNOSIS — F411 Generalized anxiety disorder: Secondary | ICD-10-CM | POA: Diagnosis not present

## 2021-09-21 NOTE — Plan of Care (Signed)

## 2021-09-21 NOTE — Progress Notes (Signed)
                Orian Amberg, PsyD 

## 2021-09-21 NOTE — Progress Notes (Signed)
[facility] Counselor Initial Adult Exam  Name: David Best Date: 09/21/2021 MRN: [id number] DOB: 06-10-1991 PCP: Haydee Salter, MD  Time spent: 11:02 am to 11:42 am; total time: 40 minutes  This session was held via video webex teletherapy due to the coronavirus risk at this time. The patient consented to video teletherapy and was located at his work during this session. He is aware it is the responsibility of the patient to secure confidentiality on his end of the session. The provider was in a private home office for the duration of this session. Limits of confidentiality were discussed with the patient.   Guardian/Payee:  NA    Paperwork requested: No   Reason for Visit /Presenting Problem: Anxiety  Mental Status Exam: Appearance:   Well Groomed     Behavior:  Appropriate  Motor:  Normal  Speech/Language:   Clear and Coherent  Affect:  Appropriate  Mood:  normal  Thought process:  normal  Thought content:    WNL  Sensory/Perceptual disturbances:    WNL  Orientation:  oriented to person, place, and time/date  Attention:  Good  Concentration:  Good  Memory:  WNL  Fund of knowledge:   Good  Insight:    Fair  Judgment:   Fair  Impulse Control:  Good     Reported Symptoms:  The patient endorsed experiencing the following: racing thoughts, feeling on edge, difficulty controlling the worries, avoiding activities that may contribute to physiological symptoms, feeling restless, tight muscles, heart palpitations,shortness of breath, and difficulty controlling the worries. He denied suicidal and homicidal ideation.   Risk Assessment: Danger to Self:  No Self-injurious Behavior: No Danger to Others: No Duty to Warn:no Physical Aggression / Violence:No  Access to Firearms a concern: No  Gang Involvement:No  Patient / guardian was educated about steps to take if suicide or homicide risk level increases between visits: n/a While future psychiatric  events cannot be accurately predicted, the patient does not currently require acute inpatient psychiatric care and does not currently meet [facility] involuntary commitment criteria.  Substance Abuse History: Current substance abuse: No     Past Psychiatric History:   Previous psychological history is significant for anxiety Outpatient Providers:NA History of Psych Hospitalization: No  Psychological Testing:  NA    Abuse History:  Victim of: No.,  NA    Report needed: No. Victim of Neglect:No. Perpetrator of  NA   Witness / Exposure to Domestic Violence: No   Protective Services Involvement: No  Witness to [employer] Violence:  No   Family History:  Family History  Problem Relation Age of Onset   Hypertension Father    Cancer Maternal Aunt        Lung   Cancer Maternal Aunt        Lung   Diabetes Paternal Uncle    Anxiety disorder Paternal Grandmother    Diabetes Paternal Grandmother    Colon polyps Paternal Grandmother    Heart disease Paternal Grandfather    Transient ischemic attack Paternal Grandfather    Pancreatic cancer Paternal Grandfather    Colon cancer Neg Hx    Esophageal cancer Neg Hx    Liver cancer Neg Hx    Liver disease Neg Hx     Living situation: the patient lives with their spouse  Sexual Orientation: Straight  Relationship Status: married  Name of spouse / other:Carly If a parent, number of children / ages:NA  Support Systems: spouse parents  Financial Stress:  No   Income/Employment/Disability: Employment  Armed forces logistics/support/administrative officer: No   Educational History: Education: post Forensic psychologist work or degree  Religion/Sprituality/World View: Patient identified as Engineer, manufacturing.   Any cultural differences that may affect / interfere with treatment:  not applicable   Recreation/Hobbies: Being with family  Stressors: Other: different stressors about different facets of life    Strengths: Supportive Relationships, Family, Friends, Church,  [place], and Self Advocate  Barriers:  NA   Legal History: Pending legal issue / charges: The patient has no significant history of legal issues. History of legal issue / charges:  NA  Medical History/Surgical History: reviewed Past Medical History:  Diagnosis Date   Anxiety    Asthma    as a child   GERD (gastroesophageal reflux disease)     Past Surgical History:  Procedure Laterality Date   INGUINAL HERNIA REPAIR Left     Medications: Current Outpatient Medications  Medication Sig Dispense Refill   hydrOXYzine (ATARAX) 10 MG tablet Take 1 tablet (10 mg total) by mouth 3 (three) times daily as needed. 30 tablet 2   omeprazole (PRILOSEC) 20 MG capsule TAKE 1 CAPSULE BY MOUTH ONCE A DAY 30 capsule 11   rosuvastatin (CRESTOR) 5 MG tablet Take 1 tablet (5 mg total) by mouth daily. 90 tablet 3   venlafaxine XR (EFFEXOR XR) 75 MG 24 hr capsule Take 1 capsule (75 mg total) by mouth daily with breakfast. 30 capsule 3   Vitamin D, Ergocalciferol, (DRISDOL) 1.25 MG (50000 UNIT) CAPS capsule Take 1 capsule (50,000 Units total) by mouth every 7 (seven) days. 5 capsule 1   No current facility-administered medications for this visit.    No Known Allergies  Diagnoses:  F41.1 generalized anxiety disorder   Plan of Care: The patient is a 31 year old Caucasian male who was referred due to experiencing anxiety based symptoms. The patient lives with his wife, and two dogs. The patient meets criteria for a diagnosis of F41.1 generalized anxiety disorder based off of the following: racing thoughts, feeling on edge, difficulty controlling the worries, avoiding activities that may contribute to physiological symptoms, feeling restless, tight muscles, heart palpitations,shortness of breath, and difficulty controlling the worries. He denied suicidal and homicidal ideation.   The patient stated that he wants better insight into self, coping strategies and ways to move forward.   This  psychologist makes the recommendation that the patient participate in counseling at least once a month.   Conception Chancy, PsyD

## 2021-09-22 ENCOUNTER — Other Ambulatory Visit (HOSPITAL_COMMUNITY): Payer: Self-pay

## 2021-10-09 ENCOUNTER — Other Ambulatory Visit (HOSPITAL_COMMUNITY): Payer: Self-pay

## 2021-10-19 ENCOUNTER — Ambulatory Visit (INDEPENDENT_AMBULATORY_CARE_PROVIDER_SITE_OTHER): Payer: No Typology Code available for payment source | Admitting: Psychologist

## 2021-10-19 DIAGNOSIS — F411 Generalized anxiety disorder: Secondary | ICD-10-CM | POA: Diagnosis not present

## 2021-10-19 NOTE — Progress Notes (Signed)
[facility] Counselor/Therapist Progress Note ? ?Patient ID: David Best, MRN: [id number],   ? ?Date: 10/19/2021 ? ?Time Spent: 11:03 am to 11:43 am; total time: 40 minutes  ? ?This session was held via video webex teletherapy due to the coronavirus risk at this time. The patient consented to video teletherapy and was located at his work during this session. He is aware it is the responsibility of the patient to secure confidentiality on his end of the session. The provider was in a private home office for the duration of this session. Limits of confidentiality were discussed with the patient.  ? ?Treatment Type: Individual Therapy ? ?Reported Symptoms: Anxiety ? ?Mental Status Exam: ?Appearance:  Well Groomed     ?Behavior: Appropriate  ?Motor: Normal  ?Speech/Language:  Clear and Coherent  ?Affect: Appropriate  ?Mood: normal  ?Thought process: normal  ?Thought content:   WNL  ?Sensory/Perceptual disturbances:   WNL  ?Orientation: oriented to person, place, and time/date  ?Attention: Good  ?Concentration: Good  ?Memory: WNL  ?Fund of knowledge:  Good  ?Insight:   Good  ?Judgment:  Good  ?Impulse Control: Good  ? ?Risk Assessment: ?Danger to Self:  No ?Self-injurious Behavior: No ?Danger to Others: No ?Duty to Warn:no ?Physical Aggression / Violence:No  ?Access to Firearms a concern: No  ?Gang Involvement:No  ? ?Subjective: Beginning the session, the patient described himself as doing better. Elaborating, he indicated that he believes the medicine has assisted him. From there, he reviewed the treatment plan. After discussing the treatment plan, patient stated that he wanted coping skills. After discussing, practicing, and processing coping skills the patient described himself as better. He was agreeable to homework and following up. He denied suicidal and homicidal ideation.   ? ?Interventions:  Worked on developing a therapeutic relationship with the patient using active listening and  reflective statements. Provided emotional support using empathy and validation. Reviewed the treatment plan with the patient. Reviewed events since the intake. Praised patient for doing better and explored what has assisted the patient. Validated expressed thoughts. Provided psychoeducation about mindfulness, mindful moments, defusion, and guided imagery. Practiced and processed mindfulness, defusion, and guided imagery. Praised patient for experiencing less distress. Assisted in problem solving. Assigned homework. Assessed for suicidal and homicidal ideation.  ? ?Homework: Implement strategies discussed ? ?Next Session: Review homework, emotional support ? ?Diagnosis: F41.1 generalized anxiety disorder  ? ?Plan:  ? ?Client Abilities: Friendly and easy to develop rapport ? ?Client Preferences: ACT and CBT ? ?Client statement of Needs: Coping techniques ? ?Treatment Level: Outpatient  ? ?Goals ?Reduce overall frequency, intensity, and duration of anxiety ?Stabilize anxiety level wile increasing ability to function ?Enhance ability to effectively cope with full variety of stressors ?Learn and implement coping skills that result in a reduction of anxiety  ? ?Objectives target date for all objectives is 09/21/2022 ?Verbalize an understanding of the cognitive, physiological, and behavioral components of anxiety ?Learning and implement calming skills to reduce overall anxiety ?Verbalize an understanding of the role that cognitive biases play in excessive irrational worry and persistent anxiety symptoms ?Identify, challenge, and replace based fearful talk ?Learn and implement problem solving strategies ?Identify and engage in pleasant activities ?Learning and implement personal and interpersonal skills to reduce anxiety and improve interpersonal relationships ?Learn to accept limitations in life and commit to tolerating, rather than avoiding, unpleasant emotions while accomplishing meaningful goals ?Identify major life  conflicts from the past and present that form the basis for present anxiety ?Maintain  involvement in work, family, and social activities ?Reestablish a consistent sleep-wake cycle ?Cooperate with a medical evaluation  ?Interventions ?Engage the patient in behavioral activation ?Use instruction, modeling, and role-playing to build the client's general social, communication, and/or conflict resolution skills ?Use Acceptance and Commitment Therapy to help client accept uncomfortable realities in order to accomplish value-consistent goals ?Reinforce the client's insight into the role of his/her past emotional pain and present anxiety  ?Support the client in following through with work, family, and social activities ?Teach and implement sleep hygiene practices  ?Refer the patient to a physician for a psychotropic medication consultation ?Monito the clint's psychotropic medication compliance ?Discuss how anxiety typically involves excessive worry, various bodily expressions of tension, and avoidance of what is threatening that interact to maintain the problem  ?Teach the patient relaxation skills ?Assign the patient homework ?Discuss examples demonstrating that unrealistic worry overestimates the probability of threats and underestimates patient's ability  ?Assist the patient in analyzing his or her worries ?Help patient understand that avoidance is reinforcing  ? ?The patient and clinician reviewed the treatment plan on 10/19/2021. The patient approved of the treatment plan.  ? ? ?Conception Chancy, PsyD ? ? ? ?

## 2021-10-19 NOTE — Progress Notes (Signed)
                Juwan Vences, PsyD 

## 2021-11-02 ENCOUNTER — Ambulatory Visit (INDEPENDENT_AMBULATORY_CARE_PROVIDER_SITE_OTHER): Payer: No Typology Code available for payment source | Admitting: Psychologist

## 2021-11-02 DIAGNOSIS — F411 Generalized anxiety disorder: Secondary | ICD-10-CM | POA: Diagnosis not present

## 2021-11-02 NOTE — Progress Notes (Signed)
[facility] Counselor/Therapist Progress Note ? ?Patient ID: David Best, MRN: [id number],   ? ?Date: 11/02/2021 ? ?Time Spent: 2:04 pm to 2:30 pm; total time: 26 minutes ? ?This session was held via video webex teletherapy due to the coronavirus risk at this time. The patient consented to video teletherapy and was located at his work during this session. He is aware it is the responsibility of the patient to secure confidentiality on his end of the session. The provider was in a private home office for the duration of this session. Limits of confidentiality were discussed with the patient.  ? ?Treatment Type: Individual Therapy ? ?Reported Symptoms: Less anxiety ? ?Mental Status Exam: ?Appearance:  Well Groomed     ?Behavior: Appropriate  ?Motor: Normal  ?Speech/Language:  Clear and Coherent  ?Affect: Appropriate  ?Mood: normal  ?Thought process: normal  ?Thought content:   WNL  ?Sensory/Perceptual disturbances:   WNL  ?Orientation: oriented to person, place, and time/date  ?Attention: Good  ?Concentration: Good  ?Memory: WNL  ?Fund of knowledge:  Good  ?Insight:   Good  ?Judgment:  Good  ?Impulse Control: Good  ? ?Risk Assessment: ?Danger to Self:  No ?Self-injurious Behavior: No ?Danger to Others: No ?Duty to Warn:no ?Physical Aggression / Violence:No  ?Access to Firearms a concern: No  ?Gang Involvement:No  ? ?Subjective: Beginning the session, the patient described himself as doing well and indicated that coping skills have assisted him. He processed thoughts and emotions he was experiencing. He reflected on ways to use strategies more consistently through the day and how to address concerns related to health. He was agreeable to following up. He denied suicidal and homicidal ideation.   ? ?Interventions:  Worked on developing a therapeutic relationship with the patient using active listening and reflective statements. Provided emotional support using empathy and validation. Used summary  statements. Praised patient for doing well and explored what has assisted the patient. Normalized and validated expressed thoughts and emotions. Used socratic questions to assist the patient gain insight into self. Processed how patient can implement tools more consistently throughout the day and in a variety of different situations. Discussed next steps in counseling. Provided empathic statements. Assisted in problem solving. Assigned homework. Assessed for suicidal and homicidal ideation.  ? ?Homework: Implement strategies at work with others ? ?Next Session: Review homework, emotional support ? ?Diagnosis: F41.1 generalized anxiety disorder  ? ?Plan:  ? ?Client Abilities: Friendly and easy to develop rapport ? ?Client Preferences: ACT and CBT ? ?Client statement of Needs: Coping techniques ? ?Treatment Level: Outpatient  ? ?Goals ?Reduce overall frequency, intensity, and duration of anxiety ?Stabilize anxiety level wile increasing ability to function ?Enhance ability to effectively cope with full variety of stressors ?Learn and implement coping skills that result in a reduction of anxiety  ? ?Objectives target date for all objectives is 09/21/2022 ?Verbalize an understanding of the cognitive, physiological, and behavioral components of anxiety ?Learning and implement calming skills to reduce overall anxiety ?Verbalize an understanding of the role that cognitive biases play in excessive irrational worry and persistent anxiety symptoms ?Identify, challenge, and replace based fearful talk ?Learn and implement problem solving strategies ?Identify and engage in pleasant activities ?Learning and implement personal and interpersonal skills to reduce anxiety and improve interpersonal relationships ?Learn to accept limitations in life and commit to tolerating, rather than avoiding, unpleasant emotions while accomplishing meaningful goals ?Identify major life conflicts from the past and present that form the basis for  present  anxiety ?Maintain involvement in work, family, and social activities ?Reestablish a consistent sleep-wake cycle ?Cooperate with a medical evaluation  ?Interventions ?Engage the patient in behavioral activation ?Use instruction, modeling, and role-playing to build the client's general social, communication, and/or conflict resolution skills ?Use Acceptance and Commitment Therapy to help client accept uncomfortable realities in order to accomplish value-consistent goals ?Reinforce the client's insight into the role of his/her past emotional pain and present anxiety  ?Support the client in following through with work, family, and social activities ?Teach and implement sleep hygiene practices  ?Refer the patient to a physician for a psychotropic medication consultation ?Monito the clint's psychotropic medication compliance ?Discuss how anxiety typically involves excessive worry, various bodily expressions of tension, and avoidance of what is threatening that interact to maintain the problem  ?Teach the patient relaxation skills ?Assign the patient homework ?Discuss examples demonstrating that unrealistic worry overestimates the probability of threats and underestimates patient's ability  ?Assist the patient in analyzing his or her worries ?Help patient understand that avoidance is reinforcing  ? ?The patient and clinician reviewed the treatment plan on 10/19/2021. The patient approved of the treatment plan.  ? ? ?Conception Chancy, PsyD ? ? ? ? ? ? ? ? ? ? ? ? ? ? ? ? ? ?Conception Chancy, PsyD ?

## 2021-11-06 ENCOUNTER — Other Ambulatory Visit: Payer: Self-pay | Admitting: Family Medicine

## 2021-11-06 ENCOUNTER — Other Ambulatory Visit (HOSPITAL_COMMUNITY): Payer: Self-pay

## 2021-11-06 ENCOUNTER — Encounter: Payer: Self-pay | Admitting: Family Medicine

## 2021-11-06 DIAGNOSIS — F411 Generalized anxiety disorder: Secondary | ICD-10-CM

## 2021-11-06 MED ORDER — VENLAFAXINE HCL ER 75 MG PO CP24
75.0000 mg | ORAL_CAPSULE | Freq: Every day | ORAL | 3 refills | Status: DC
  Filled 2021-11-06: qty 30, 30d supply, fill #0
  Filled 2021-12-07: qty 30, 30d supply, fill #1
  Filled 2022-01-08: qty 30, 30d supply, fill #2
  Filled 2022-02-10: qty 30, 30d supply, fill #3

## 2021-11-16 ENCOUNTER — Ambulatory Visit (INDEPENDENT_AMBULATORY_CARE_PROVIDER_SITE_OTHER): Payer: No Typology Code available for payment source | Admitting: Family Medicine

## 2021-11-16 ENCOUNTER — Encounter: Payer: Self-pay | Admitting: Family Medicine

## 2021-11-16 VITALS — BP 136/76 | HR 82 | Temp 97.7°F | Ht 73.0 in | Wt 204.2 lb

## 2021-11-16 DIAGNOSIS — R030 Elevated blood-pressure reading, without diagnosis of hypertension: Secondary | ICD-10-CM

## 2021-11-16 DIAGNOSIS — F411 Generalized anxiety disorder: Secondary | ICD-10-CM | POA: Diagnosis not present

## 2021-11-16 DIAGNOSIS — E782 Mixed hyperlipidemia: Secondary | ICD-10-CM

## 2021-11-16 LAB — LIPID PANEL
Cholesterol: 182 mg/dL (ref 0–200)
HDL: 49.4 mg/dL (ref >39.00)
LDL Cholesterol: 101 mg/dL — ABNORMAL HIGH (ref 0–99)
NonHDL: 132.91
Total CHOL/HDL Ratio: 4
Triglycerides: 159 mg/dL — ABNORMAL HIGH (ref 0.0–149.0)
VLDL: 31.8 mg/dL (ref 0.0–40.0)

## 2021-11-16 NOTE — Progress Notes (Signed)
?[facility] ?[facility] ?[place] ?[place] [address] ?Dept: [phone] ?Dept Fax: [phone] ? ?Chronic Care Office Visit ? ?Subjective:  ? ? Patient ID: David Best, male    DOB: March 11, 1991, 31 y.o..   MRN: [id number] ? ?Chief Complaint  ?Patient presents with  ? Follow-up  ?  3 month f/u.   No concerns.  Average BP 111/74 - 132/88.   ? ? ?History of Present Illness: ? ?Patient is in today for reassessment of chronic medical issues. ? ?Mr. David Best has a history of generalized anxiety. I started him on venlafaxine this Fall. We titrated his dose up and added hydroxyzine over time. He is now engaged in counseling and has found that this has been very helpful for him. He is no longer having anxiety related to going to work. He has seen improvements in anxiety in crowded restaurants and was able to go to [employer] last month with his wife.  ?  ?Mr. David Best has a history of hyperlipidemia. He feels his has made some significant dietary changes. He is tolerating rosuvastatin well. ? ?Past Medical History: ?Patient Active Problem List  ? Diagnosis Date Noted  ? Elevated blood pressure reading in office without diagnosis of hypertension 08/17/2021  ? GERD (gastroesophageal reflux disease) 05/22/2021  ? Hyperlipidemia 05/22/2021  ? Vitamin D deficiency 05/22/2021  ? Generalized anxiety disorder 05/22/2021  ? ?Past Surgical History:  ?Procedure Laterality Date  ? INGUINAL HERNIA REPAIR Left   ? ?Family History  ?Problem Relation Age of Onset  ? Hypertension Father   ? Cancer Maternal Aunt   ?     Lung  ? Cancer Maternal Aunt   ?     Lung  ? Diabetes Paternal Uncle   ? Anxiety disorder Paternal Grandmother   ? Diabetes Paternal Grandmother   ? Colon polyps Paternal Grandmother   ? Heart disease Paternal Grandfather   ? Transient ischemic attack Paternal Grandfather   ? Pancreatic cancer Paternal Grandfather   ? Colon cancer Neg Hx   ?  Esophageal cancer Neg Hx   ? Liver cancer Neg Hx   ? Liver disease Neg Hx   ? ?Outpatient Medications Prior to Visit  ?Medication Sig Dispense Refill  ? hydrOXYzine (ATARAX) 10 MG tablet Take 1 tablet (10 mg total) by mouth 3 (three) times daily as needed. 30 tablet 2  ? omeprazole (PRILOSEC) 20 MG capsule TAKE 1 CAPSULE BY MOUTH ONCE A DAY 30 capsule 11  ? rosuvastatin (CRESTOR) 5 MG tablet Take 1 tablet (5 mg total) by mouth daily. 90 tablet 3  ? venlafaxine XR (EFFEXOR XR) 75 MG 24 hr capsule Take 1 capsule (75 mg total) by mouth daily with breakfast. 30 capsule 3  ? Vitamin D, Ergocalciferol, (DRISDOL) 1.25 MG (50000 UNIT) CAPS capsule Take 1 capsule (50,000 Units total) by mouth every 7 (seven) days. 5 capsule 1  ? ?No facility-administered medications prior to visit.  ? ?No Known Allergies ?   ?Objective:  ? ?Today's Vitals  ? 11/16/21 1341  ?BP: 136/76  ?Pulse: 82  ?Temp: 97.7 ?F (36.5 ?C)  ?TempSrc: Temporal  ?SpO2: 95%  ?Weight: 204 lb 3.2 oz (92.6 kg)  ?Height: '6\' 1"'$  (1.854 m)  ? ?Body mass index is 26.94 kg/m?.  ? ?General: Well developed, well nourished. No acute distress. ?Psych: Alert and oriented. Normal mood and affect. ? ?Health Maintenance Due  ?Topic Date Due  ? HIV Screening  Never done  ?  Hepatitis C Screening  Never done  ?   ?Assessment & Plan:  ? ?1. Generalized anxiety disorder ?Improved with counseling and on venlefaxine XR 75 mg daily and PRN hydroxyzine. I will plan to reassess him in 6 months. ? ?2. Elevated blood pressure reading in office without diagnosis of hypertension ?Home blood pressures ranging 111-132/74-88. We willc otninue to monitor this for now. ? ?3. Mixed hyperlipidemia ?Now on Crestor 5 mg daily. We will repeat his lipid profile today. ? ?- Lipid panel ? ?Return in about 6 months (around 05/18/2022) for Reassessment.  ? ?Haydee Salter, MD ?

## 2021-11-19 ENCOUNTER — Other Ambulatory Visit (HOSPITAL_COMMUNITY): Payer: Self-pay

## 2021-12-07 ENCOUNTER — Other Ambulatory Visit (HOSPITAL_COMMUNITY): Payer: Self-pay

## 2021-12-14 ENCOUNTER — Ambulatory Visit (INDEPENDENT_AMBULATORY_CARE_PROVIDER_SITE_OTHER): Payer: No Typology Code available for payment source | Admitting: Psychologist

## 2021-12-14 DIAGNOSIS — F411 Generalized anxiety disorder: Secondary | ICD-10-CM

## 2021-12-14 NOTE — Progress Notes (Signed)
[place] Counselor/Therapist Progress Note  Patient ID: David Best, MRN: [id number],    Date: 12/14/2021  Time Spent: 11:00 am to 11:40 am; total time: 40 minutes  This session was held via video webex teletherapy due to the coronavirus risk at this time. The patient consented to video teletherapy and was located at his work during this session. He is aware it is the responsibility of the patient to secure confidentiality on his end of the session. The provider was in a private home office for the duration of this session. Limits of confidentiality were discussed with the patient.   Treatment Type: Individual Therapy  Reported Symptoms: Less anxiety  Mental Status Exam: Appearance:  Well Groomed     Behavior: Appropriate  Motor: Normal  Speech/Language:  Clear and Coherent  Affect: Appropriate  Mood: normal  Thought process: normal  Thought content:   WNL  Sensory/Perceptual disturbances:   WNL  Orientation: oriented to person, place, and time/date  Attention: Good  Concentration: Good  Memory: WNL  Fund of knowledge:  Good  Insight:   Good  Judgment:  Good  Impulse Control: Good   Risk Assessment: Danger to Self:  No Self-injurious Behavior: No Danger to Others: No Duty to Warn:no Physical Aggression / Violence:No  Access to Firearms a concern: No  Gang Involvement:No   Subjective: Beginning the session, the patient described himself as doing well indicating that a combination of medication and coping skills have assisted. Elaborating, he reflected on one incident in which he had to rely more on coping strategies and it was very effective for him. He explored potential barriers to continuing doing well. After processing these barriers, he asked to follow up in three months. He denied suicidal and homicidal ideation.    Interventions:  Worked on developing a therapeutic relationship with the patient using active listening and reflective  statements. Provided emotional support using empathy and validation. Praised the patient for doing well and explored what has assisted the patient. Normalized and validated expressed thoughts and emotions. Used socratic questions to assist the patient gain insight into self. Challenged some of the thoughts expressed. Explored potential barriers. Provided psychoeducation about what reinforces anxiety and the evidenced based treatment. Praised the patient for pushing forward when experiencing symptoms. Used self-disclosure. Assisted in problem solving. Processed thoughts and emotions. Discussed next steps for counseling. Assessed for suicidal and homicidal ideation.   Homework: NA  Next Session: Emotional support. Possibly terminate   Diagnosis: F41.1 generalized anxiety disorder   Plan:   Client Abilities: Friendly and easy to develop rapport  Client Preferences: ACT and CBT  Client statement of Needs: Coping techniques  Treatment Level: Outpatient   Goals Reduce overall frequency, intensity, and duration of anxiety Stabilize anxiety level wile increasing ability to function Enhance ability to effectively cope with full variety of stressors Learn and implement coping skills that result in a reduction of anxiety   Objectives target date for all objectives is 09/21/2022 Verbalize an understanding of the cognitive, physiological, and behavioral components of anxiety Learning and implement calming skills to reduce overall anxiety Verbalize an understanding of the role that cognitive biases play in excessive irrational worry and persistent anxiety symptoms Identify, challenge, and replace based fearful talk Learn and implement problem solving strategies Identify and engage in pleasant activities Learning and implement personal and interpersonal skills to reduce anxiety and improve interpersonal relationships Learn to accept limitations in life and commit to tolerating, rather than avoiding,  unpleasant emotions while  accomplishing meaningful goals Identify major life conflicts from the past and present that form the basis for present anxiety Maintain involvement in work, family, and social activities Reestablish a consistent sleep-wake cycle Cooperate with a medical evaluation  Interventions Engage the patient in behavioral activation Use instruction, modeling, and role-playing to build the client's general social, communication, and/or conflict resolution skills Use Acceptance and Commitment Therapy to help client accept uncomfortable realities in order to accomplish value-consistent goals Reinforce the client's insight into the role of his/her past emotional pain and present anxiety  Support the client in following through with work, family, and social activities Teach and implement sleep hygiene practices  Refer the patient to a physician for a psychotropic medication consultation Monito the clint's psychotropic medication compliance Discuss how anxiety typically involves excessive worry, various bodily expressions of tension, and avoidance of what is threatening that interact to maintain the problem  Teach the patient relaxation skills Assign the patient homework Discuss examples demonstrating that unrealistic worry overestimates the probability of threats and underestimates patient's ability  Assist the patient in analyzing his or her worries Help patient understand that avoidance is reinforcing   The patient and clinician reviewed the treatment plan on 10/19/2021. The patient approved of the treatment plan.    Conception Chancy, PsyD

## 2021-12-30 ENCOUNTER — Other Ambulatory Visit: Payer: Self-pay

## 2021-12-30 ENCOUNTER — Ambulatory Visit
Admission: RE | Admit: 2021-12-30 | Discharge: 2021-12-30 | Disposition: A | Discharge status: Home / Self Care | Payer: No Typology Code available for payment source | Source: Ambulatory Visit | Attending: Emergency Medicine | Admitting: Emergency Medicine

## 2021-12-30 VITALS — BP 123/76 | HR 75 | Temp 98.0°F | Resp 18

## 2021-12-30 DIAGNOSIS — J309 Allergic rhinitis, unspecified: Secondary | ICD-10-CM

## 2021-12-30 DIAGNOSIS — H6120 Impacted cerumen, unspecified ear: Secondary | ICD-10-CM

## 2021-12-30 DIAGNOSIS — H6121 Impacted cerumen, right ear: Secondary | ICD-10-CM | POA: Diagnosis not present

## 2021-12-30 MED ORDER — FEXOFENADINE HCL 180 MG PO TABS
180.0000 mg | ORAL_TABLET | Freq: Every day | ORAL | 1 refills | Status: DC
  Filled 2021-12-30: qty 90, 90d supply, fill #0

## 2021-12-30 MED ORDER — FLUTICASONE PROPIONATE 50 MCG/ACT NA SUSP
1.0000 | Freq: Every day | NASAL | 1 refills | Status: DC
  Filled 2021-12-30: qty 16, 30d supply, fill #0
  Filled 2022-05-13: qty 16, 30d supply, fill #1
  Filled 2022-08-25: qty 16, 60d supply, fill #2
  Filled 2022-12-01: qty 16, 60d supply, fill #3

## 2021-12-30 NOTE — ED Provider Notes (Signed)
[facility]    CSN: [id number] Arrival date & time: 12/30/21  [id number]    HISTORY   Chief Complaint  Patient presents with   Ear Fullness    URI this past week. I usually experience fullness in my ears after this. R ear feels full, irritated. Possibly blocked. I have my ears irrigated ~2x yearly so this is common for me. Hopeful to get it irrigated today. - Entered by patient   HPI David Best is a 31 y.o. male. Patient complains to urgent care today complaining of having upper respiratory infection about a week ago.  Patient states his right ear feels full irritated is concerned it might be full of wax.  Patient states he has a history of having to have earwax removed from his ears every 6 months or so.  Patient states he would like to have his ear irrigated if this is the issue today.  Patient denies fever, aches, chills, nausea, vomiting, diarrhea.  Patient reports a history of allergies, currently using Allegra for allergy symptoms.  The history is provided by the patient.  Past Medical History:  Diagnosis Date   Anxiety    Asthma    as a child   GERD (gastroesophageal reflux disease)    Patient Active Problem List   Diagnosis Date Noted   Elevated blood pressure reading in office without diagnosis of hypertension 08/17/2021   GERD (gastroesophageal reflux disease) 05/22/2021   Hyperlipidemia 05/22/2021   Vitamin D deficiency 05/22/2021   Generalized anxiety disorder 05/22/2021   Past Surgical History:  Procedure Laterality Date   INGUINAL HERNIA REPAIR Left     Home Medications    Prior to Admission medications   Medication Sig Start Date End Date Taking? Authorizing Provider  hydrOXYzine (ATARAX) 10 MG tablet Take 1 tablet (10 mg total) by mouth 3 (three) times daily as needed. 07/06/21   Haydee Salter, MD  omeprazole (PRILOSEC) 20 MG capsule TAKE 1 CAPSULE BY MOUTH ONCE A DAY 05/22/21 05/22/22  Haydee Salter, MD  rosuvastatin (CRESTOR) 5 MG  tablet Take 1 tablet (5 mg total) by mouth daily. 08/19/21   Haydee Salter, MD  venlafaxine XR (EFFEXOR XR) 75 MG 24 hr capsule Take 1 capsule (75 mg total) by mouth daily with breakfast. 11/06/21   Haydee Salter, MD  Vitamin D, Ergocalciferol, (DRISDOL) 1.25 MG (50000 UNIT) CAPS capsule Take 1 capsule (50,000 Units total) by mouth every 7 (seven) days. 07/06/21   Haydee Salter, MD   Family History Family History  Problem Relation Age of Onset   Hypertension Father    Cancer Maternal Aunt        Lung   Cancer Maternal Aunt        Lung   Diabetes Paternal Uncle    Anxiety disorder Paternal Grandmother    Diabetes Paternal Grandmother    Colon polyps Paternal Grandmother    Heart disease Paternal Grandfather    Transient ischemic attack Paternal Grandfather    Pancreatic cancer Paternal Grandfather    Colon cancer Neg Hx    Esophageal cancer Neg Hx    Liver cancer Neg Hx    Liver disease Neg Hx    Social History Social History   Tobacco Use   Smoking status: Never   Smokeless tobacco: Never  Vaping Use   Vaping Use: Never used  Substance Use Topics   Alcohol use: Yes    Comment: 1-2 beer weekly   Drug use:  Never   Allergies   Patient has no known allergies.  Review of Systems Review of Systems Pertinent findings noted in history of present illness.   Physical Exam Triage Vital Signs ED Triage Vitals  Enc Vitals Group     BP 05/22/21 0827 (!) 147/82     Pulse Rate 05/22/21 0827 72     Resp 05/22/21 0827 18     Temp 05/22/21 0827 98.3 F (36.8 C)     Temp Source 05/22/21 0827 Oral     SpO2 05/22/21 0827 98 %     Weight --      Height --      Head Circumference --      Peak Flow --      Pain Score 05/22/21 0826 5     Pain Loc --      Pain Edu? --      Excl. in [place]? --   No data found.  Updated Vital Signs BP 123/76 (BP Location: Right Arm)   Pulse 75   Temp 98 F (36.7 C) (Oral)   Resp 18   SpO2 97%   Physical Exam Vitals and nursing note  reviewed.  Constitutional:      General: He is awake. He is not in acute distress.    Appearance: Normal appearance. He is well-developed, well-groomed and normal weight. He is not ill-appearing.  HENT:     Head: Normocephalic and atraumatic.     Salivary Glands: Right salivary gland is not diffusely enlarged or tender. Left salivary gland is not diffusely enlarged or tender.     Right Ear: Hearing and external ear normal. There is impacted cerumen.     Left Ear: Hearing, tympanic membrane, ear canal and external ear normal. No drainage.  No middle ear effusion. There is no impacted cerumen (Cerumen present in left ear canal without impaction). Tympanic membrane is not erythematous or bulging.     Nose: Nose normal. No nasal deformity, septal deviation, mucosal edema, congestion or rhinorrhea.     Right Turbinates: Enlarged, swollen and pale.     Left Turbinates: Enlarged, swollen and pale.     Right Sinus: No maxillary sinus tenderness or frontal sinus tenderness.     Left Sinus: No maxillary sinus tenderness or frontal sinus tenderness.     Mouth/Throat:     Lips: Pink. No lesions.     Mouth: Mucous membranes are moist. No oral lesions.     Tongue: No lesions.     Palate: No mass.     Pharynx: Oropharynx is clear. Uvula midline. No pharyngeal swelling, oropharyngeal exudate, posterior oropharyngeal erythema or uvula swelling.     Tonsils: No tonsillar exudate. 0 on the right. 0 on the left.     Comments: Postnasal drip Eyes:     General: Lids are normal.        Right eye: No discharge.        Left eye: No discharge.     Extraocular Movements: Extraocular movements intact.     Conjunctiva/sclera: Conjunctivae normal.     Right eye: Right conjunctiva is not injected.     Left eye: Left conjunctiva is not injected.  Neck:     Trachea: Trachea and phonation normal.  Cardiovascular:     Rate and Rhythm: Normal rate and regular rhythm.     Pulses: Normal pulses.     Heart sounds:  Normal heart sounds. No murmur heard.   No friction rub. No gallop.  Pulmonary:  Effort: Pulmonary effort is normal. No accessory muscle usage, prolonged expiration or respiratory distress.     Breath sounds: Normal breath sounds. No stridor, decreased air movement or transmitted upper airway sounds. No decreased breath sounds, wheezing, rhonchi or rales.  Chest:     Chest wall: No tenderness.  Musculoskeletal:        General: Normal range of motion.     Cervical back: Normal range of motion and neck supple. Normal range of motion.  Lymphadenopathy:     Cervical: No cervical adenopathy.  Skin:    General: Skin is warm and dry.     Findings: No erythema or rash.  Neurological:     General: No focal deficit present.     Mental Status: He is alert and oriented to person, place, and time.  Psychiatric:        Mood and Affect: Mood normal.        Behavior: Behavior normal. Behavior is cooperative.    Visual Acuity Right Eye Distance:   Left Eye Distance:   Bilateral Distance:    Right Eye Near:   Left Eye Near:    Bilateral Near:     [facility] Couse / Diagnostics / Procedures:    EKG  Radiology No results found.  Procedures Ear Cerumen Removal  Date/Time: 12/30/2021 10:11 AM Performed by: Johna Sheriff, RN Authorized by: Lynden Oxford Scales, PA-C   Consent:    Consent obtained:  Verbal   Consent given by:  Patient   Risks, benefits, and alternatives were discussed: yes     Risks discussed:  Bleeding, dizziness, incomplete removal, infection, pain and TM perforation   Alternatives discussed:  No treatment, delayed treatment, alternative treatment, observation and referral Universal protocol:    Procedure explained and questions answered to patient or proxy's satisfaction: yes     Patient identity confirmed:  Verbally with patient and arm band Procedure details:    Location:  L ear and R ear   Procedure type: irrigation     Procedure outcomes: cerumen removed    Post-procedure details:    Inspection:  Ear canal clear, no bleeding and TM intact   Hearing quality:  Normal   Procedure completion:  Tolerated (including critical care time)  UC Diagnoses / Final Clinical Impressions(s)   I have reviewed the triage vital signs and the nursing notes.  Pertinent labs & imaging results that were available during my care of the patient were reviewed by me and considered in my medical decision making (see chart for details).   Final diagnoses:  Cerumen in auditory canal on examination  Impacted cerumen of right ear  Allergic rhinitis, unspecified seasonality, unspecified trigger   Cerumen removed from both ears without difficulty.  Patient advised to continue allergy medications, fexofenadine and Flonase, prescriptions provided.  ED Prescriptions     Medication Sig Dispense Auth. Provider   fexofenadine (ALLEGRA) 180 MG tablet Take 1 tablet (180 mg total) by mouth daily. 90 tablet Lynden Oxford Scales, PA-C   fluticasone (FLONASE) 50 MCG/ACT nasal spray Place 1 spray into both nostrils daily. 48 g Lynden Oxford Scales, PA-C      PDMP not reviewed this encounter.  Pending results:  Labs Reviewed - No data to display  Medications Ordered in UC: Medications - No data to display  Disposition Upon Discharge:  Condition: stable for discharge home Home: take medications as prescribed; routine discharge instructions as discussed; follow up as advised.  Patient presented with an acute illness with associated  systemic symptoms and significant discomfort requiring urgent management. In my opinion, this is a condition that a prudent lay person (someone who possesses an average knowledge of health and medicine) may potentially expect to result in complications if not addressed urgently such as respiratory distress, impairment of bodily function or dysfunction of bodily organs.   Routine symptom specific, illness specific and/or disease specific  instructions were discussed with the patient and/or caregiver at length.   As such, the patient has been evaluated and assessed, work-up was performed and treatment was provided in alignment with urgent care protocols and evidence based medicine.  Patient/parent/caregiver has been advised that the patient may require follow up for further testing and treatment if the symptoms continue in spite of treatment, as clinically indicated and appropriate.  If the patient was tested for COVID-19, Influenza and/or RSV, then the patient/parent/guardian was advised to isolate at home pending the results of his/her diagnostic coronavirus test and potentially longer if they're positive. I have also advised pt that if his/her COVID-19 test returns positive, it's recommended to self-isolate for at least 10 days after symptoms first appeared AND until fever-free for 24 hours without fever reducer AND other symptoms have improved or resolved. Discussed self-isolation recommendations as well as instructions for household member/close contacts as per the [facility] and [place] DHHS, and also gave patient the [place] packet with this information.  Patient/parent/caregiver has been advised to return to the [facility] or PCP in 3-5 days if no better; to PCP or the Emergency Department if new signs and symptoms develop, or if the current signs or symptoms continue to change or worsen for further workup, evaluation and treatment as clinically indicated and appropriate  The patient will follow up with their current PCP if and as advised. If the patient does not currently have a PCP we will assist them in obtaining one.   The patient may need specialty follow up if the symptoms continue, in spite of conservative treatment and management, for further workup, evaluation, consultation and treatment as clinically indicated and appropriate.  Patient/parent/caregiver verbalized understanding and agreement of plan as discussed.  All questions were addressed  during visit.  Please see discharge instructions below for further details of plan.  Discharge Instructions:   Discharge Instructions      Your symptoms and my physical exam findings are concerning for exacerbation of your underlying allergies.    Please see the list below for recommended medications, dosages and frequencies to provide relief of current symptoms:    Allegra (fexofenadine): This is an excellent second-generation antihistamine that helps to reduce respiratory inflammatory response to environmental allergens.  This medication is not known to cause daytime sleepiness so it can be taken in the daytime.  If you find that it does make you sleepy, please feel free to take it at bedtime.   Flonase (fluticasone): This is a steroid nasal spray that you use once daily, 1 spray in each nare.  This medication does not work well if you decide to use it only used as you feel you need to, it works best used on a daily basis.  After 3 to 5 days of use, you will notice significant reduction of the inflammation and mucus production that is currently being caused by exposure to allergens, whether seasonal or environmental.  The most common side effect of this medication is nosebleeds.  If you experience a nosebleed, please discontinue use for 1 week, then feel free to resume.  I have provided you with a  prescription.  I also find the regular use of nasal steroid sprays not only calms the turbinates and sinuses but will also reduce inflammation in the eustachian tubes as well which can ultimately decrease earwax production.  For home earwax removal, my favorite product is Debrox.  I recommend that you apply the drops for 2 to 3 days in a row then perform irrigation with the bulb syringe provided.  Placing the drops into the ear canal for 2 to 3 days allows the wax that is present in your ear to become very soft, making it much easier to remove and less traumatic.  Feel your bathroom sink with a little bit  of water.  This will catch the wax as it is irrigated from your ear.  Then fill a glass with water that is the same temperature as your body in place at the side of the sink.  Fill the bulb syringe with water from the class.  Lean over the sink with the ear that you are preparing to irrigate closest to the water in the sink.  Insert the bulb into the ear canal slightly and squeeze gently and consistently forcing water into the ear canal allowing it to run out into the water in the sink.  Observe the water in the sink for any earwax removed.  I do not recommend irrigating your ear more than 4 or 5 times per session as this can ultimately lead to trauma of the lining of the ear canal.  You can always repeat the Debrox eardrops and try again a few days later.  Thank you for visiting urgent care today.  I appreciate the opportunity participate your care.       This office note has been dictated using Museum/gallery curator.  Unfortunately, and despite my best efforts, this method of dictation can sometimes lead to occasional typographical or grammatical errors.  I apologize in advance if this occurs.     Lynden Oxford Scales, PA-C 12/30/21 1210

## 2021-12-30 NOTE — ED Triage Notes (Signed)
Pt states he recently had a URI and is now having right ear fullness.  Started: sunday

## 2021-12-30 NOTE — Discharge Instructions (Addendum)
Your symptoms and my physical exam findings are concerning for exacerbation of your underlying allergies.    Please see the list below for recommended medications, dosages and frequencies to provide relief of current symptoms:    Allegra (fexofenadine): This is an excellent second-generation antihistamine that helps to reduce respiratory inflammatory response to environmental allergens.  This medication is not known to cause daytime sleepiness so it can be taken in the daytime.  If you find that it does make you sleepy, please feel free to take it at bedtime.   Flonase (fluticasone): This is a steroid nasal spray that you use once daily, 1 spray in each nare.  This medication does not work well if you decide to use it only used as you feel you need to, it works best used on a daily basis.  After 3 to 5 days of use, you will notice significant reduction of the inflammation and mucus production that is currently being caused by exposure to allergens, whether seasonal or environmental.  The most common side effect of this medication is nosebleeds.  If you experience a nosebleed, please discontinue use for 1 week, then feel free to resume.  I have provided you with a prescription.  I also find the regular use of nasal steroid sprays not only calms the turbinates and sinuses but will also reduce inflammation in the eustachian tubes as well which can ultimately decrease earwax production.  For home earwax removal, my favorite product is Debrox.  I recommend that you apply the drops for 2 to 3 days in a row then perform irrigation with the bulb syringe provided.  Placing the drops into the ear canal for 2 to 3 days allows the wax that is present in your ear to become very soft, making it much easier to remove and less traumatic.  Feel your bathroom sink with a little bit of water.  This will catch the wax as it is irrigated from your ear.  Then fill a glass with water that is the same temperature as your body in  place at the side of the sink.  Fill the bulb syringe with water from the class.  Lean over the sink with the ear that you are preparing to irrigate closest to the water in the sink.  Insert the bulb into the ear canal slightly and squeeze gently and consistently forcing water into the ear canal allowing it to run out into the water in the sink.  Observe the water in the sink for any earwax removed.  I do not recommend irrigating your ear more than 4 or 5 times per session as this can ultimately lead to trauma of the lining of the ear canal.  You can always repeat the Debrox eardrops and try again a few days later.  Thank you for visiting urgent care today.  I appreciate the opportunity participate your care.

## 2022-01-07 ENCOUNTER — Encounter: Payer: Self-pay | Admitting: Family Medicine

## 2022-01-07 ENCOUNTER — Ambulatory Visit (INDEPENDENT_AMBULATORY_CARE_PROVIDER_SITE_OTHER): Payer: No Typology Code available for payment source | Admitting: Family Medicine

## 2022-01-07 VITALS — BP 122/80 | HR 88 | Temp 97.7°F | Ht 73.0 in | Wt 200.4 lb

## 2022-01-07 DIAGNOSIS — Z0000 Encounter for general adult medical examination without abnormal findings: Secondary | ICD-10-CM | POA: Diagnosis not present

## 2022-01-07 DIAGNOSIS — E782 Mixed hyperlipidemia: Secondary | ICD-10-CM

## 2022-01-07 DIAGNOSIS — R030 Elevated blood-pressure reading, without diagnosis of hypertension: Secondary | ICD-10-CM

## 2022-01-07 NOTE — Progress Notes (Signed)
[facility] [address] [place] [address] Dept: [phone] Dept Fax: [phone]  Annual Physical Visit  Subjective:    Patient ID: David Best, male    DOB: 1991-04-27, 31 y.o..   MRN: [id number]  Chief Complaint  Patient presents with   Annual Exam    CPE/labs.  Fasting today.  No concerns.      History of Present Illness:  Patient is in today for an annual physical/preventative visit.  Mr. David Best notes he is doing well overall. He has increased his walking to 45 min, 6 days a week. He has a history of hyperlipidemia with elevated Lp (a) and Apo B. He is currently managed on rosuvastatin. His last LDL was down 42% from previous. He has had some high blood pressure in the past, but this has been improved with his lifestyle measures.  Mr. David Best has a history of GAD. He is doing much better on venlafaxine XR 75 mg daily and PRN hydroxyzine.  Review of Systems  Constitutional:  Negative for chills, diaphoresis, fever, malaise/fatigue and weight loss.  HENT:  Negative for congestion, ear discharge, ear pain, hearing loss, sinus pain and sore throat.   Eyes:  Negative for blurred vision, pain, discharge and redness.  Respiratory:  Negative for cough, shortness of breath and wheezing.   Cardiovascular:  Negative for chest pain, palpitations and leg swelling.  Gastrointestinal:  Positive for heartburn. Negative for abdominal pain, constipation, diarrhea, nausea and vomiting.       Rare heartburn. Takes occasional Prilosec if he anticpates eating food that will trigger this.  Genitourinary:  Negative for dysuria, frequency and urgency.  Musculoskeletal:  Negative for back pain, joint pain and myalgias.  Skin:  Negative for rash.  Neurological:  Negative for tingling, sensory change, focal weakness and weakness.  Endo/Heme/Allergies:  Positive for environmental allergies.       Occasional mild seasonal  allergy symptoms. Manages with OTC meds.   Past Medical History: Patient Active Problem List   Diagnosis Date Noted   Elevated blood pressure reading in office without diagnosis of hypertension 08/17/2021   GERD (gastroesophageal reflux disease) 05/22/2021   Hyperlipidemia 05/22/2021   Vitamin D deficiency 05/22/2021   Generalized anxiety disorder 05/22/2021   Past Surgical History:  Procedure Laterality Date   INGUINAL HERNIA REPAIR Left    Family History  Problem Relation Age of Onset   Hypertension Father    Cancer Maternal Aunt        Lung   Cancer Maternal Aunt        Lung   Diabetes Paternal Uncle    Anxiety disorder Paternal Grandmother    Diabetes Paternal Grandmother    Colon polyps Paternal Grandmother    Heart disease Paternal Grandfather    Transient ischemic attack Paternal Grandfather    Pancreatic cancer Paternal Grandfather    Colon cancer Neg Hx    Esophageal cancer Neg Hx    Liver cancer Neg Hx    Liver disease Neg Hx    Outpatient Medications Prior to Visit  Medication Sig Dispense Refill   fexofenadine (ALLEGRA) 180 MG tablet Take 1 tablet (180 mg total) by mouth daily. 90 tablet 1   fluticasone (FLONASE) 50 MCG/ACT nasal spray Place 1 spray into both nostrils daily. 48 g 1   hydrOXYzine (ATARAX) 10 MG tablet Take 1 tablet (10 mg total) by mouth 3 (three) times daily as needed. 30 tablet 2   omeprazole (  PRILOSEC) 20 MG capsule TAKE 1 CAPSULE BY MOUTH ONCE A DAY 30 capsule 11   rosuvastatin (CRESTOR) 5 MG tablet Take 1 tablet (5 mg total) by mouth daily. 90 tablet 3   venlafaxine XR (EFFEXOR XR) 75 MG 24 hr capsule Take 1 capsule (75 mg total) by mouth daily with breakfast. 30 capsule 3   Vitamin D, Ergocalciferol, (DRISDOL) 1.25 MG (50000 UNIT) CAPS capsule Take 1 capsule (50,000 Units total) by mouth every 7 (seven) days. 5 capsule 1   No facility-administered medications prior to visit.   No Known Allergies    Objective:   Today's Vitals    01/07/22 0815  BP: 122/80  Pulse: 88  Temp: 97.7 F (36.5 C)  TempSrc: Temporal  SpO2: 98%  Weight: 200 lb 6.4 oz (90.9 kg)  Height: '6\' 1"'$  (1.854 m)   Body mass index is 26.44 kg/m.   General: Well developed, well nourished. No acute distress. HEENT: Normocephalic, non-traumatic. PERRL, EOMI. Conjunctiva clear. Fundiscopic exam shows normal disc   and vasculature. External ears normal. EAC and TMs normal bilaterally. Nose clear without congestion or   rhinorrhea. Mucous membranes moist. Mild cobblestoning in posterior oropharynx clear. Good dentition. Neck: Supple. No lymphadenopathy. No thyromegaly. Lungs: Clear to auscultation bilaterally. No wheezing, rales or rhonchi. CV: RRR without murmurs or rubs. Pulses 2+ bilaterally. Abdomen: Soft, non-tender. Bowel sounds positive, normal pitch and frequency. No hepatosplenomegaly. No   rebound or guarding. Extremities: Full ROM. No joint swelling or tenderness. No edema noted. Skin: Warm and dry. No rashes. Psych: Alert and oriented. Normal mood and affect.  Health Maintenance Due  Topic Date Due   HIV Screening  Never done   Hepatitis C Screening  Never done   Lab Results Last lipids Lab Results  Component Value Date   CHOL 182 11/16/2021   HDL 49.40 11/16/2021   LDLCALC 101 (H) 11/16/2021   TRIG 159.0 (H) 11/16/2021   CHOLHDL 4 11/16/2021   Assessment & Plan:   1. Annual physical exam Mr. David Best is in excellent health. I encouraged him to continue with his current cardio workouts. He might consider the addition of some occasional strength training as well. UTD on health screenings.   2. Mixed hyperlipidemia Continue rosuvastatin 5 mg. We will recheck fasting lipids in Oct.  3. Elevated blood pressure reading in office without diagnosis of hypertension Blood pressure is overall improved with lifestyle changes. We will cotninue to monitor this.  Return in about 4 months (around 05/09/2022) for Reassessment.    Haydee Salter, MD

## 2022-01-08 ENCOUNTER — Other Ambulatory Visit (HOSPITAL_COMMUNITY): Payer: Self-pay

## 2022-02-10 ENCOUNTER — Other Ambulatory Visit (HOSPITAL_COMMUNITY): Payer: Self-pay

## 2022-03-05 ENCOUNTER — Encounter: Payer: Self-pay | Admitting: Family Medicine

## 2022-03-05 ENCOUNTER — Other Ambulatory Visit (HOSPITAL_COMMUNITY): Payer: Self-pay

## 2022-03-05 DIAGNOSIS — F411 Generalized anxiety disorder: Secondary | ICD-10-CM

## 2022-03-05 MED ORDER — VENLAFAXINE HCL ER 75 MG PO CP24
75.0000 mg | ORAL_CAPSULE | Freq: Every day | ORAL | 3 refills | Status: DC
  Filled 2022-03-05: qty 90, 90d supply, fill #0
  Filled 2022-06-07: qty 90, 90d supply, fill #1
  Filled 2022-09-06: qty 90, 90d supply, fill #2
  Filled 2022-12-01: qty 90, 90d supply, fill #3

## 2022-03-06 ENCOUNTER — Other Ambulatory Visit (HOSPITAL_COMMUNITY): Payer: Self-pay

## 2022-03-15 ENCOUNTER — Ambulatory Visit (INDEPENDENT_AMBULATORY_CARE_PROVIDER_SITE_OTHER): Payer: No Typology Code available for payment source | Admitting: Psychologist

## 2022-03-15 DIAGNOSIS — F411 Generalized anxiety disorder: Secondary | ICD-10-CM

## 2022-03-15 NOTE — Progress Notes (Signed)
[facility] Counselor/Therapist Progress Note  Patient ID: David Best, MRN: [id number],    Date: 03/15/2022  Time Spent: 11:02 am to 11:27 am; total time: 25 minutes  This session was held via video webex teletherapy due to the coronavirus risk at this time. The patient consented to video teletherapy and was located at his work during this session. He is aware it is the responsibility of the patient to secure confidentiality on his end of the session. The provider was in a private home office for the duration of this session. Limits of confidentiality were discussed with the patient.   Treatment Type: Individual Therapy  Reported Symptoms: Denied symptoms  Mental Status Exam: Appearance:  Well Groomed     Behavior: Appropriate  Motor: Normal  Speech/Language:  Clear and Coherent  Affect: Appropriate  Mood: normal  Thought process: normal  Thought content:   WNL  Sensory/Perceptual disturbances:   WNL  Orientation: oriented to person, place, and time/date  Attention: Good  Concentration: Good  Memory: WNL  Fund of knowledge:  Good  Insight:   Good  Judgment:  Good  Impulse Control: Good   Risk Assessment: Danger to Self:  No Self-injurious Behavior: No Danger to Others: No Duty to Warn:no Physical Aggression / Violence:No  Access to Firearms a concern: No  Gang Involvement:No   Subjective: Beginning the session, the patient described himself as doing well denying experiencing symptoms. He processed what has helped. He talked about how the therapeutic relationship assisted him. He identified barriers and how to overcome those barriers. He terminated services. He denied suicidal and homicidal ideation.    Interventions:  Worked on developing a therapeutic relationship with the patient using active listening and reflective statements. Provided emotional support using empathy and validation. Used summary and reflective statements. Praised the patient  for doing well. Reflected on what has assisted the patient. Processed events since the last session. Explored what strategies have been most helpful. Reflected on the therapeutic process and what patient learned about self through the process. Explored how the therapeutic relationship assisted the patient.  Assessed for suicidal and homicidal ideation.   Homework: NA. Terminated services  Next Session: Terminated services  Diagnosis: F41.1 generalized anxiety disorder   Plan:   Client Abilities: Friendly and easy to develop rapport  Client Preferences: ACT and CBT  Client statement of Needs: Coping techniques  Treatment Level: Outpatient   Goals Reduce overall frequency, intensity, and duration of anxiety Stabilize anxiety level wile increasing ability to function Enhance ability to effectively cope with full variety of stressors Learn and implement coping skills that result in a reduction of anxiety   Objectives target date for all objectives is 09/21/2022 Verbalize an understanding of the cognitive, physiological, and behavioral components of anxiety Learning and implement calming skills to reduce overall anxiety Verbalize an understanding of the role that cognitive biases play in excessive irrational worry and persistent anxiety symptoms Identify, challenge, and replace based fearful talk Learn and implement problem solving strategies Identify and engage in pleasant activities Learning and implement personal and interpersonal skills to reduce anxiety and improve interpersonal relationships Learn to accept limitations in life and commit to tolerating, rather than avoiding, unpleasant emotions while accomplishing meaningful goals Identify major life conflicts from the past and present that form the basis for present anxiety Maintain involvement in work, family, and social activities Reestablish a consistent sleep-wake cycle Cooperate with a medical evaluation   Interventions Engage the patient in behavioral activation Use instruction,  modeling, and role-playing to build the client's general social, communication, and/or conflict resolution skills Use Acceptance and Commitment Therapy to help client accept uncomfortable realities in order to accomplish value-consistent goals Reinforce the client's insight into the role of his/her past emotional pain and present anxiety  Support the client in following through with work, family, and social activities Teach and implement sleep hygiene practices  Refer the patient to a physician for a psychotropic medication consultation Monito the clint's psychotropic medication compliance Discuss how anxiety typically involves excessive worry, various bodily expressions of tension, and avoidance of what is threatening that interact to maintain the problem  Teach the patient relaxation skills Assign the patient homework Discuss examples demonstrating that unrealistic worry overestimates the probability of threats and underestimates patient's ability  Assist the patient in analyzing his or her worries Help patient understand that avoidance is reinforcing   The patient and clinician reviewed the treatment plan on 10/19/2021. The patient approved of the treatment plan.    Conception Chancy, PsyD

## 2022-05-05 ENCOUNTER — Encounter: Payer: Self-pay | Admitting: Family Medicine

## 2022-05-05 ENCOUNTER — Ambulatory Visit (INDEPENDENT_AMBULATORY_CARE_PROVIDER_SITE_OTHER): Payer: No Typology Code available for payment source | Admitting: Family Medicine

## 2022-05-05 VITALS — BP 126/82 | HR 72 | Temp 97.7°F | Ht 73.0 in | Wt 200.8 lb

## 2022-05-05 DIAGNOSIS — Z1159 Encounter for screening for other viral diseases: Secondary | ICD-10-CM

## 2022-05-05 DIAGNOSIS — R7401 Elevation of levels of liver transaminase levels: Secondary | ICD-10-CM

## 2022-05-05 DIAGNOSIS — E782 Mixed hyperlipidemia: Secondary | ICD-10-CM | POA: Diagnosis not present

## 2022-05-05 DIAGNOSIS — R030 Elevated blood-pressure reading, without diagnosis of hypertension: Secondary | ICD-10-CM | POA: Diagnosis not present

## 2022-05-05 DIAGNOSIS — F411 Generalized anxiety disorder: Secondary | ICD-10-CM | POA: Diagnosis not present

## 2022-05-05 LAB — LIPID PANEL
Cholesterol: 166 mg/dL (ref 0–200)
HDL: 45 mg/dL (ref >39.00)
LDL Cholesterol: 103 mg/dL — ABNORMAL HIGH (ref 0–99)
NonHDL: 121.14
Total CHOL/HDL Ratio: 4
Triglycerides: 93 mg/dL (ref 0.0–149.0)
VLDL: 18.6 mg/dL (ref 0.0–40.0)

## 2022-05-05 LAB — COMPREHENSIVE METABOLIC PANEL
ALT: 57 U/L — ABNORMAL HIGH (ref 0–53)
AST: 32 U/L (ref 0–37)
Albumin: 4.8 g/dL (ref 3.5–5.2)
Alkaline Phosphatase: 45 U/L (ref 39–117)
BUN: 14 mg/dL (ref 6–23)
CO2: 27 mEq/L (ref 19–32)
Calcium: 9.9 mg/dL (ref 8.4–10.5)
Chloride: 103 mEq/L (ref 96–112)
Creatinine, Ser: 0.97 mg/dL (ref 0.40–1.50)
GFR: 104.9 mL/min (ref >60.00)
Glucose, Bld: 92 mg/dL (ref 70–99)
Potassium: 4 mEq/L (ref 3.5–5.1)
Sodium: 138 mEq/L (ref 135–145)
Total Bilirubin: 0.6 mg/dL (ref 0.2–1.2)
Total Protein: 7.7 g/dL (ref 6.0–8.3)

## 2022-05-05 NOTE — Addendum Note (Signed)
Addended by: Haydee Salter on: 05/05/2022 05:47 PM   Modules accepted: Orders

## 2022-05-05 NOTE — Progress Notes (Signed)
[facility] [address] [place] [address] Dept: [phone] Dept Fax: [phone]  Chronic Care Office Visit  Subjective:    Patient ID: David Best, male    DOB: Nov 30, 1990, 31 y.o..   MRN: [id number]  Chief Complaint  Patient presents with   Follow-up    4 month f/u .  No concerns.  Fasting today.  Will get flu shot at work.     History of Present Illness:  Patient is in today for reassessment of chronic medical issues.  David Best has a history of generalized anxiety. He is managed on venlafaxine XR 75 mg daily and hydroxyzine 10 mg TID PRN. Since his last appointment, he had a series of visits with Conception Chancy, PhD (pschology). He found this very helpful. He feels his anxiety, esp. in crowded situations as improved. He is finding he is achieving a better work/life balance. He feels very pleased at his progress.   David Best has a history of hyperlipidemia. He implemented lifestyle changes, including diet and exercise. He has a history of elevated Lp (a) and Apo B. He is currently managed on rosuvastatin 5 mg daily.   David Best has had some high blood pressure in the past, but this has been improved with his lifestyle measures.    Past Medical History: Patient Active Problem List   Diagnosis Date Noted   Elevated blood pressure reading in office without diagnosis of hypertension 08/17/2021   GERD (gastroesophageal reflux disease) 05/22/2021   Hyperlipidemia 05/22/2021   Vitamin D deficiency 05/22/2021   Generalized anxiety disorder 05/22/2021   Past Surgical History:  Procedure Laterality Date   INGUINAL HERNIA REPAIR Left    Family History  Problem Relation Age of Onset   Hypertension Father    Cancer Maternal Aunt        Lung   Cancer Maternal Aunt        Lung   Diabetes Paternal Uncle    Anxiety disorder Paternal Grandmother    Diabetes Paternal Grandmother    Colon  polyps Paternal Grandmother    Heart disease Paternal Grandfather    Transient ischemic attack Paternal Grandfather    Pancreatic cancer Paternal Grandfather    Colon cancer Neg Hx    Esophageal cancer Neg Hx    Liver cancer Neg Hx    Liver disease Neg Hx    Outpatient Medications Prior to Visit  Medication Sig Dispense Refill   fexofenadine (ALLEGRA) 180 MG tablet Take 1 tablet (180 mg total) by mouth daily. 90 tablet 1   fluticasone (FLONASE) 50 MCG/ACT nasal spray Place 1 spray into both nostrils daily. 48 g 1   hydrOXYzine (ATARAX) 10 MG tablet Take 1 tablet (10 mg total) by mouth 3 (three) times daily as needed. 30 tablet 2   omeprazole (PRILOSEC) 20 MG capsule TAKE 1 CAPSULE BY MOUTH ONCE A DAY 30 capsule 11   rosuvastatin (CRESTOR) 5 MG tablet Take 1 tablet (5 mg total) by mouth daily. 90 tablet 3   venlafaxine XR (EFFEXOR XR) 75 MG 24 hr capsule Take 1 capsule (75 mg total) by mouth daily with breakfast. 90 capsule 3   No facility-administered medications prior to visit.   No Known Allergies    Objective:   Today's Vitals   05/05/22 0820  BP: 126/82  Pulse: 72  Temp: 97.7 F (36.5 C)  TempSrc: Temporal  SpO2: 99%  Weight: 200 lb 12.8 oz (91.1 kg)  Height: '6\' 1"'$  (1.854 m)   Body mass index is 26.49 kg/m.   General: Well developed, well nourished. No acute distress. Psych: Alert and oriented. Normal mood and affect.  Health Maintenance Due  Topic Date Due   HIV Screening  Never done   Hepatitis C Screening  Never done   INFLUENZA VACCINE  02/23/2022     Assessment & Plan:   1. Mixed hyperlipidemia Due for repeat lipids today. I will also check LFTs to assure he is having no issues with  his meds on his liver. Our plan would be to continue his rosuvastatin 5 mg daily.  - Comprehensive metabolic panel - Lipid panel  2. Generalized anxiety disorder Improved. Continue venlafaxine XR 75 mg daily and hydroxyzine 10 mg TID PRN  3. Elevated blood pressure  reading in office without diagnosis of hypertension Blood pressure remains in a good range. Continue lifestyle approaches.  - Comprehensive metabolic panel  4. Encounter for hepatitis C screening test for low risk patient  - HCV Ab w Reflex to Quant PCR  Return in about 6 months (around 11/04/2022) for Reassessment.   Haydee Salter, MD

## 2022-05-06 LAB — HCV INTERPRETATION

## 2022-05-06 LAB — HCV AB W REFLEX TO QUANT PCR: HCV Ab: NONREACTIVE

## 2022-05-13 ENCOUNTER — Other Ambulatory Visit (HOSPITAL_COMMUNITY): Payer: Self-pay

## 2022-05-14 ENCOUNTER — Other Ambulatory Visit (HOSPITAL_COMMUNITY): Payer: Self-pay

## 2022-05-15 ENCOUNTER — Other Ambulatory Visit (HOSPITAL_COMMUNITY): Payer: Self-pay

## 2022-06-02 ENCOUNTER — Other Ambulatory Visit (INDEPENDENT_AMBULATORY_CARE_PROVIDER_SITE_OTHER): Payer: No Typology Code available for payment source

## 2022-06-02 DIAGNOSIS — R7401 Elevation of levels of liver transaminase levels: Secondary | ICD-10-CM

## 2022-06-02 LAB — HEPATIC FUNCTION PANEL
ALT: 45 U/L (ref 0–53)
AST: 25 U/L (ref 0–37)
Albumin: 4.8 g/dL (ref 3.5–5.2)
Alkaline Phosphatase: 44 U/L (ref 39–117)
Bilirubin, Direct: 0.1 mg/dL (ref 0.0–0.3)
Total Bilirubin: 0.4 mg/dL (ref 0.2–1.2)
Total Protein: 7.1 g/dL (ref 6.0–8.3)

## 2022-06-07 ENCOUNTER — Other Ambulatory Visit (HOSPITAL_COMMUNITY): Payer: Self-pay

## 2022-08-25 ENCOUNTER — Other Ambulatory Visit: Payer: Self-pay | Admitting: Family Medicine

## 2022-08-25 ENCOUNTER — Other Ambulatory Visit (HOSPITAL_COMMUNITY): Payer: Self-pay

## 2022-08-25 ENCOUNTER — Other Ambulatory Visit: Payer: Self-pay

## 2022-08-25 ENCOUNTER — Encounter: Payer: Self-pay | Admitting: Family Medicine

## 2022-08-25 DIAGNOSIS — E782 Mixed hyperlipidemia: Secondary | ICD-10-CM

## 2022-08-25 MED ORDER — ROSUVASTATIN CALCIUM 5 MG PO TABS
5.0000 mg | ORAL_TABLET | Freq: Every day | ORAL | 3 refills | Status: DC
  Filled 2022-08-25: qty 90, 90d supply, fill #0
  Filled 2022-11-18: qty 90, 90d supply, fill #1
  Filled 2023-02-16: qty 90, 90d supply, fill #2
  Filled 2023-05-17: qty 90, 90d supply, fill #3

## 2022-09-07 ENCOUNTER — Other Ambulatory Visit: Payer: Self-pay

## 2022-11-03 ENCOUNTER — Ambulatory Visit: Payer: 59 | Admitting: Family Medicine

## 2022-11-10 ENCOUNTER — Encounter: Payer: Self-pay | Admitting: Family Medicine

## 2022-11-10 ENCOUNTER — Ambulatory Visit (INDEPENDENT_AMBULATORY_CARE_PROVIDER_SITE_OTHER): Payer: 59 | Admitting: Family Medicine

## 2022-11-10 VITALS — BP 126/70 | HR 76 | Temp 97.7°F | Resp 16 | Ht 73.0 in | Wt 193.4 lb

## 2022-11-10 DIAGNOSIS — E782 Mixed hyperlipidemia: Secondary | ICD-10-CM | POA: Diagnosis not present

## 2022-11-10 DIAGNOSIS — F411 Generalized anxiety disorder: Secondary | ICD-10-CM

## 2022-11-10 LAB — LIPID PANEL
Cholesterol: 149 mg/dL (ref 0–200)
HDL: 46.5 mg/dL (ref >39.00)
LDL Cholesterol: 75 mg/dL (ref 0–99)
NonHDL: 102.75
Total CHOL/HDL Ratio: 3
Triglycerides: 141 mg/dL (ref 0.0–149.0)
VLDL: 28.2 mg/dL (ref 0.0–40.0)

## 2022-11-10 NOTE — Assessment & Plan Note (Signed)
We will reassess lipids today. His weight loss may have helped improve his lipid mix. I will continue rosuvastatin 5 mg daily.

## 2022-11-10 NOTE — Assessment & Plan Note (Signed)
Improved and appears in remission on medication. Continue counseling and venlafaxine XR 75 mg daily.

## 2022-11-10 NOTE — Progress Notes (Signed)
[facility] [address] [place] [address] Dept: [phone] Dept Fax: [phone]  Chronic Care Office Visit  Subjective:    Patient ID: David Best, male    DOB: 1991/02/28, 32 y.o..   MRN: [id number]  Chief Complaint  Patient presents with   Follow-up   History of Present Illness:  Patient is in today for reassessment of chronic medical issues.  Mr. Lois Huxley has a history of generalized anxiety. He is managed on venlafaxine XR 75 mg daily and hydroxyzine 10 mg TID PRN. Overall, he is doing well. He notes he attended the [employer] Tournament and did not have difficulty int he crowded setting. He continues to work with Dr. Bosie Clos.   Mr. Lois Huxley has a history of hyperlipidemia. He continues lifestyle changes, including diet and exercise. He has a history of elevated Lp (a) and Apo B. He is currently managed on rosuvastatin 5 mg daily. He has seen some weight loss over the past 6 months and is pleased with this.  Past Medical History: Patient Active Problem List   Diagnosis Date Noted   GERD (gastroesophageal reflux disease) 05/22/2021   Hyperlipidemia 05/22/2021   Vitamin D deficiency 05/22/2021   Generalized anxiety disorder 05/22/2021   Past Surgical History:  Procedure Laterality Date   INGUINAL HERNIA REPAIR Left    Family History  Problem Relation Age of Onset   Hypertension Father    Cancer Maternal Aunt        Lung   Cancer Maternal Aunt        Lung   Diabetes Paternal Uncle    Anxiety disorder Paternal Grandmother    Diabetes Paternal Grandmother    Colon polyps Paternal Grandmother    Heart disease Paternal Grandfather    Transient ischemic attack Paternal Grandfather    Pancreatic cancer Paternal Grandfather    Colon cancer Neg Hx    Esophageal cancer Neg Hx    Liver cancer Neg Hx    Liver disease Neg Hx    Outpatient Medications Prior to Visit  Medication Sig Dispense Refill    fluticasone (FLONASE) 50 MCG/ACT nasal spray Place 1 spray into both nostrils daily. 48 g 1   hydrOXYzine (ATARAX) 10 MG tablet Take 1 tablet (10 mg total) by mouth 3 (three) times daily as needed. 30 tablet 2   omeprazole (PRILOSEC) 20 MG capsule TAKE 1 CAPSULE BY MOUTH ONCE A DAY 30 capsule 11   rosuvastatin (CRESTOR) 5 MG tablet Take 1 tablet (5 mg total) by mouth daily. 90 tablet 3   venlafaxine XR (EFFEXOR XR) 75 MG 24 hr capsule Take 1 capsule (75 mg total) by mouth daily with breakfast. 90 capsule 3   fexofenadine (ALLEGRA) 180 MG tablet Take 1 tablet (180 mg total) by mouth daily. (Patient not taking: Reported on 11/10/2022) 90 tablet 1   No facility-administered medications prior to visit.   No Known Allergies Objective:   Today's Vitals   11/10/22 1249  BP: 126/70  Pulse: 76  Resp: 16  Temp: 97.7 F (36.5 C)  TempSrc: Oral  SpO2: 97%  Weight: 193 lb 6 oz (87.7 kg)  Height:  (1.854 m)   Body mass index is 25.51 kg/m.   General: Well developed, well nourished. No acute distress. Psych: Alert and oriented. Normal mood and affect.  Health Maintenance Due  Topic Date Due   HIV Screening  Never done   DTaP/Tdap/Td (1 - Tdap) Never done  Assessment & Plan:   Problem List Items Addressed This Visit       Other   Hyperlipidemia - Primary    We will reassess lipids today. His weight loss may have helped improve his lipid mix. I will continue rosuvastatin 5 mg daily.      Relevant Orders   Lipid panel   Generalized anxiety disorder    Improved and appears in remission on medication. Continue counseling and venlafaxine XR 75 mg daily.       Return in about 3 months (around 02/09/2023) for Annual preventative care.   Loyola Mast, MD

## 2022-11-18 ENCOUNTER — Other Ambulatory Visit (HOSPITAL_COMMUNITY): Payer: Self-pay

## 2022-12-01 ENCOUNTER — Other Ambulatory Visit (HOSPITAL_COMMUNITY): Payer: Self-pay

## 2023-02-02 ENCOUNTER — Other Ambulatory Visit (HOSPITAL_COMMUNITY): Payer: Self-pay

## 2023-02-02 ENCOUNTER — Ambulatory Visit (INDEPENDENT_AMBULATORY_CARE_PROVIDER_SITE_OTHER): Payer: 59 | Admitting: Family Medicine

## 2023-02-02 ENCOUNTER — Other Ambulatory Visit: Payer: Self-pay

## 2023-02-02 ENCOUNTER — Encounter: Payer: Self-pay | Admitting: Family Medicine

## 2023-02-02 VITALS — BP 124/80 | HR 85 | Temp 97.3°F | Ht 73.0 in | Wt 196.0 lb

## 2023-02-02 DIAGNOSIS — F411 Generalized anxiety disorder: Secondary | ICD-10-CM

## 2023-02-02 DIAGNOSIS — E782 Mixed hyperlipidemia: Secondary | ICD-10-CM | POA: Diagnosis not present

## 2023-02-02 DIAGNOSIS — Z0000 Encounter for general adult medical examination without abnormal findings: Secondary | ICD-10-CM

## 2023-02-02 MED ORDER — VENLAFAXINE HCL ER 75 MG PO CP24
75.0000 mg | ORAL_CAPSULE | Freq: Every day | ORAL | 3 refills | Status: DC
  Filled 2023-02-02 – 2023-03-01 (×2): qty 90, 90d supply, fill #0
  Filled 2023-06-01: qty 90, 90d supply, fill #1
  Filled 2023-09-06: qty 90, 90d supply, fill #2
  Filled 2023-12-02: qty 90, 90d supply, fill #3

## 2023-02-02 NOTE — Assessment & Plan Note (Signed)
Improved and in remission on medication. Continue counseling and venlafaxine XR 75 mg daily.

## 2023-02-02 NOTE — Assessment & Plan Note (Signed)
Lipids at goal. I will continue rosuvastatin 5 mg daily.

## 2023-02-02 NOTE — Progress Notes (Signed)
[facility] [address] [place] [address] Dept: [phone] Dept Fax: [phone]  Annual Physical Visit  Subjective:    Patient ID: David Best, male    DOB: 23-Jul-1991, 32 y.o..   MRN: [id number]  Chief Complaint  Patient presents with   Annual Exam    CPE/labs.  Fasting today.      History of Present Illness:  Patient is in today for an annual physical/preventative visit.  Mr. Lois Huxley has a history of generalized anxiety. He is managed on venlafaxine XR 75 mg daily and hydroxyzine 10 mg TID PRN. Overall, he is doing well. He continues to work with Dr. Bosie Clos.   Mr. Lois Huxley has a history of hyperlipidemia. He continues lifestyle changes, including diet and exercise. He has a history of elevated Lp (a) and Apo B. He is currently managed on rosuvastatin 5 mg daily.   Review of Systems  Constitutional:  Negative for chills, diaphoresis, fever, malaise/fatigue and weight loss.  HENT:  Negative for congestion, ear pain, hearing loss, sinus pain, sore throat and tinnitus.   Eyes:  Negative for blurred vision, pain, discharge and redness.  Respiratory:  Negative for cough, shortness of breath and wheezing.   Cardiovascular:  Negative for chest pain and palpitations.  Gastrointestinal:  Negative for abdominal pain, constipation, diarrhea, heartburn, nausea and vomiting.  Musculoskeletal:  Negative for back pain, joint pain and myalgias.  Skin:  Negative for itching and rash.  Psychiatric/Behavioral:  Negative for depression. The patient is not nervous/anxious.    Past Medical History: Patient Active Problem List   Diagnosis Date Noted   GERD (gastroesophageal reflux disease) 05/22/2021   Hyperlipidemia 05/22/2021   Vitamin D deficiency 05/22/2021   Generalized anxiety disorder 05/22/2021   Past Surgical History:  Procedure Laterality Date   INGUINAL HERNIA REPAIR Left    Family History  Problem  Relation Age of Onset   Hypertension Father    Cancer Maternal Aunt        Lung   Cancer Maternal Aunt        Lung   Diabetes Paternal Uncle    Anxiety disorder Paternal Grandmother    Diabetes Paternal Grandmother    Colon polyps Paternal Grandmother    Heart disease Paternal Grandfather    Transient ischemic attack Paternal Grandfather    Pancreatic cancer Paternal Grandfather    Diabetes Paternal Uncle    Colon cancer Neg Hx    Esophageal cancer Neg Hx    Liver cancer Neg Hx    Liver disease Neg Hx    Outpatient Medications Prior to Visit  Medication Sig Dispense Refill   fexofenadine (ALLEGRA) 180 MG tablet Take by mouth.     fluticasone (FLONASE) 50 MCG/ACT nasal spray Place 1 spray into both nostrils daily. 48 g 1   hydrOXYzine (ATARAX) 10 MG tablet Take 1 tablet (10 mg total) by mouth 3 (three) times daily as needed. 30 tablet 2   rosuvastatin (CRESTOR) 5 MG tablet Take 1 tablet (5 mg total) by mouth daily. 90 tablet 3   venlafaxine XR (EFFEXOR XR) 75 MG 24 hr capsule Take 1 capsule (75 mg total) by mouth daily with breakfast. 90 capsule 3   omeprazole (PRILOSEC) 20 MG capsule TAKE 1 CAPSULE BY MOUTH ONCE A DAY (Patient not taking: Reported on 02/02/2023) 30 capsule 11   No facility-administered medications prior to visit.   No Known Allergies Objective:   Today's Vitals   02/02/23  0817  BP: 124/80  Pulse: 85  Temp: (!) 97.3 F (36.3 C)  TempSrc: Temporal  SpO2: 97%  Weight: 196 lb (88.9 kg)  Height: 6\' 1"  (1.854 m)   Body mass index is 25.86 kg/m.   General: Well developed, well nourished. No acute distress. HEENT: Normocephalic, non-traumatic. PERRL, EOMI. Conjunctiva clear. External ears   normal. EAC and TMs normal bilaterally. Nose clear without congestion or rhinorrhea. Mucous   membranes moist. Oropharynx clear. Good dentition. Neck: Supple. No lymphadenopathy. No thyromegaly. Lungs: Clear to auscultation bilaterally. No wheezing, rales or  rhonchi. CV: RRR without murmurs or rubs. Pulses 2+ bilaterally. Abdomen: Soft, non-tender. Bowel sounds positive, normal pitch and frequency. No   hepatosplenomegaly. No rebound or guarding. Extremities: Full ROM. No joint swelling or tenderness. No edema noted. Skin: Warm and dry. No rashes. Psych: Alert and oriented. Normal mood and affect.  Health Maintenance Due  Topic Date Due   HIV Screening  Never done   Lab Results Last lipids Lab Results  Component Value Date   CHOL 149 11/10/2022   HDL 46.50 11/10/2022   LDLCALC 75 11/10/2022   TRIG 141.0 11/10/2022   CHOLHDL 3 11/10/2022     Assessment & Plan:   Problem List Items Addressed This Visit       Other   Hyperlipidemia    Lipids at goal. I will continue rosuvastatin 5 mg daily.      Generalized anxiety disorder    Improved and in remission on medication. Continue counseling and venlafaxine XR 75 mg daily.      Relevant Medications   venlafaxine XR (EFFEXOR XR) 75 MG 24 hr capsule   Other Visit Diagnoses     Annual physical exam    -  Primary   Overall excellent health. Continue focus on helathy diet and regular exercise. UTD on screenings and immunizations.       Return in about 6 months (around 08/05/2023) for Reassessment.   Loyola Mast, MD

## 2023-02-16 ENCOUNTER — Other Ambulatory Visit (HOSPITAL_COMMUNITY): Payer: Self-pay

## 2023-02-16 ENCOUNTER — Other Ambulatory Visit: Payer: Self-pay

## 2023-03-01 ENCOUNTER — Other Ambulatory Visit (HOSPITAL_COMMUNITY): Payer: Self-pay

## 2023-03-02 ENCOUNTER — Other Ambulatory Visit (HOSPITAL_COMMUNITY): Payer: Self-pay

## 2023-03-05 ENCOUNTER — Other Ambulatory Visit (HOSPITAL_COMMUNITY): Payer: Self-pay

## 2023-03-22 ENCOUNTER — Other Ambulatory Visit (HOSPITAL_COMMUNITY): Payer: Self-pay

## 2023-03-28 ENCOUNTER — Other Ambulatory Visit (HOSPITAL_COMMUNITY): Payer: Self-pay

## 2023-04-23 ENCOUNTER — Encounter: Payer: Self-pay | Admitting: Family Medicine

## 2023-04-25 ENCOUNTER — Other Ambulatory Visit (HOSPITAL_COMMUNITY): Payer: Self-pay

## 2023-04-25 ENCOUNTER — Other Ambulatory Visit: Payer: Self-pay

## 2023-04-25 MED ORDER — FLUTICASONE PROPIONATE 50 MCG/ACT NA SUSP
1.0000 | Freq: Every day | NASAL | 1 refills | Status: DC
  Filled 2023-04-25: qty 48, 90d supply, fill #0
  Filled 2023-09-06: qty 48, 90d supply, fill #1

## 2023-05-15 ENCOUNTER — Encounter: Payer: Self-pay | Admitting: Emergency Medicine

## 2023-05-15 ENCOUNTER — Ambulatory Visit
Admission: EM | Admit: 2023-05-15 | Discharge: 2023-05-15 | Disposition: A | Discharge status: Home / Self Care | Payer: 59 | Attending: Internal Medicine | Admitting: Internal Medicine

## 2023-05-15 DIAGNOSIS — H6121 Impacted cerumen, right ear: Secondary | ICD-10-CM

## 2023-05-15 NOTE — ED Provider Notes (Addendum)
[facility]    CSN: [id number] Arrival date & time: 05/15/23  1104      History   Chief Complaint Chief Complaint  Patient presents with   Ear Fullness    HPI David Best is a 32 y.o. male presents for clogged ear.  Patient reports a couple days of right ear feeling clogged.  Denies any pain, drainage, URI symptoms, fevers or chills.  Has a history of cerumen impaction in the past requiring irrigation.  No over-the-counter treatments have been utilized.  No other concerns at this time.   Ear Fullness    Past Medical History:  Diagnosis Date   Anxiety    Asthma    as a child   GERD (gastroesophageal reflux disease)    Hyperlipidemia 12/2017    Patient Active Problem List   Diagnosis Date Noted   GERD (gastroesophageal reflux disease) 05/22/2021   Hyperlipidemia 05/22/2021   Vitamin D deficiency 05/22/2021   Generalized anxiety disorder 05/22/2021    Past Surgical History:  Procedure Laterality Date   INGUINAL HERNIA REPAIR Left        Home Medications    Prior to Admission medications   Medication Sig Start Date End Date Taking? Authorizing Provider  fexofenadine (ALLEGRA) 180 MG tablet Take by mouth. 12/30/21   [provider]  fluticasone (FLONASE) 50 MCG/ACT nasal spray Place 1 spray into both nostrils daily. 04/25/23   Loyola Mast, MD  hydrOXYzine (ATARAX) 10 MG tablet Take 1 tablet (10 mg total) by mouth 3 (three) times daily as needed. 07/06/21   Loyola Mast, MD  rosuvastatin (CRESTOR) 5 MG tablet Take 1 tablet (5 mg total) by mouth daily. 08/25/22   Loyola Mast, MD  venlafaxine XR (EFFEXOR XR) 75 MG 24 hr capsule Take 1 capsule (75 mg total) by mouth daily with breakfast. 02/02/23   Loyola Mast, MD    Family History Family History  Problem Relation Age of Onset   Hypertension Father    Cancer Maternal Aunt        Lung   Cancer Maternal Aunt        Lung   Diabetes Paternal Uncle    Anxiety disorder  Paternal Grandmother    Diabetes Paternal Grandmother    Colon polyps Paternal Grandmother    Heart disease Paternal Grandfather    Transient ischemic attack Paternal Grandfather    Pancreatic cancer Paternal Grandfather    Diabetes Paternal Uncle    Colon cancer Neg Hx    Esophageal cancer Neg Hx    Liver cancer Neg Hx    Liver disease Neg Hx     Social History Social History   Tobacco Use   Smoking status: Never   Smokeless tobacco: Never  Vaping Use   Vaping status: Never Used  Substance Use Topics   Alcohol use: Yes    Comment: Seldom (1 to 2 beers, 1 to 2 weekends per month)   Drug use: Never     Allergies   Patient has no known allergies.   Review of Systems Review of Systems  HENT:         Right ear clogged sensation      Physical Exam Triage Vital Signs ED Triage Vitals  Encounter Vitals Group     BP 05/15/23 1141 122/79     Systolic BP Percentile --      Diastolic BP Percentile --      Pulse Rate 05/15/23 1141 93  Resp 05/15/23 1141 15     Temp 05/15/23 1141 98.2 F (36.8 C)     Temp Source 05/15/23 1141 Oral     SpO2 05/15/23 1141 98 %     Weight --      Height --      Head Circumference --      Peak Flow --      Pain Score 05/15/23 1142 0     Pain Loc --      Pain Education --      Exclude from Growth Chart --    No data found.  Updated Vital Signs BP 122/79 (BP Location: Right Arm)   Pulse 93   Temp 98.2 F (36.8 C) (Oral)   Resp 15   SpO2 98%   Visual Acuity Right Eye Distance:   Left Eye Distance:   Bilateral Distance:    Right Eye Near:   Left Eye Near:    Bilateral Near:     Physical Exam Vitals reviewed.  Constitutional:      Appearance: Normal appearance.  HENT:     Head: Normocephalic and atraumatic.     Right Ear: There is impacted cerumen.     Left Ear: Tympanic membrane and ear canal normal.  Eyes:     Pupils: Pupils are equal, round, and reactive to light.  Cardiovascular:     Rate and Rhythm: Normal  rate.  Pulmonary:     Effort: Pulmonary effort is normal.  Skin:    General: Skin is warm and dry.  Neurological:     General: No focal deficit present.     Mental Status: He is alert and oriented to person, place, and time.  Psychiatric:        Mood and Affect: Mood normal.        Behavior: Behavior normal.      UC Treatments / Results  Labs (all labs ordered are listed, but only abnormal results are displayed) Labs Reviewed - No data to display  EKG   Radiology No results found.  Procedures Procedures (including critical care time)  Medications Ordered in UC Medications - No data to display  Initial Impression / Assessment and Plan / UC Course  I have reviewed the triage vital signs and the nursing notes.  Pertinent labs & imaging results that were available during my care of the patient were reviewed by me and considered in my medical decision making (see chart for details).     Irrigation performed by nursing staff.  Patient tolerated irrigation well.  After irrigation right TM and canal within normal limits.  Advised to follow-up as needed. Final Clinical Impressions(s) / UC Diagnoses   Final diagnoses:  Impacted cerumen of right ear     Discharge Instructions      Avoid Q-tips or earbuds.  Follow-up as needed     ED Prescriptions   None    PDMP not reviewed this encounter.   Radford Pax, NP 05/15/23 1247    Radford Pax, NP 05/15/23 1247

## 2023-05-15 NOTE — ED Triage Notes (Signed)
Pt reports that right ear feels clogged since yesterday. Denies pain or any issues with left ear.

## 2023-05-15 NOTE — Discharge Instructions (Signed)
Avoid Q-tips or earbuds.  Follow-up as needed

## 2023-05-17 ENCOUNTER — Other Ambulatory Visit: Payer: Self-pay

## 2023-06-02 ENCOUNTER — Other Ambulatory Visit: Payer: Self-pay

## 2023-08-03 ENCOUNTER — Ambulatory Visit: Payer: 59 | Admitting: Family Medicine

## 2023-08-10 ENCOUNTER — Other Ambulatory Visit: Payer: Self-pay

## 2023-08-10 ENCOUNTER — Other Ambulatory Visit (HOSPITAL_COMMUNITY): Payer: Self-pay

## 2023-08-10 ENCOUNTER — Ambulatory Visit (INDEPENDENT_AMBULATORY_CARE_PROVIDER_SITE_OTHER): Payer: 59 | Admitting: Family Medicine

## 2023-08-10 ENCOUNTER — Encounter: Payer: Self-pay | Admitting: Family Medicine

## 2023-08-10 VITALS — BP 118/78 | HR 76 | Temp 98.6°F | Ht 73.0 in | Wt 197.6 lb

## 2023-08-10 DIAGNOSIS — E782 Mixed hyperlipidemia: Secondary | ICD-10-CM | POA: Diagnosis not present

## 2023-08-10 DIAGNOSIS — F411 Generalized anxiety disorder: Secondary | ICD-10-CM

## 2023-08-10 MED ORDER — ROSUVASTATIN CALCIUM 5 MG PO TABS
5.0000 mg | ORAL_TABLET | Freq: Every day | ORAL | 3 refills | Status: DC
  Filled 2023-08-10: qty 90, 90d supply, fill #0
  Filled 2023-11-07: qty 90, 90d supply, fill #1
  Filled 2024-02-07: qty 90, 90d supply, fill #2
  Filled 2024-05-02: qty 90, 90d supply, fill #3

## 2023-08-10 NOTE — Progress Notes (Signed)
 [facility] [address] [place] [address] Dept: [phone] Dept Fax: [phone]  Chronic Care Office Visit  Subjective:    Patient ID: David Best, male    DOB: 06/16/1991, 33 y.o..   MRN: [id number]  Chief Complaint  Patient presents with   Follow-up    6 month f/u.    History of Present Illness:  Patient is in today for reassessment of chronic medical issues.  Mr. David Best has a history of generalized anxiety. He is managed on venlafaxine  XR 75 mg daily and hydroxyzine  10 mg TID PRN. Overall, he is doing well. He recently attended a large wedding and notes his social anxiety did not surface.   Mr. David Best has a history of hyperlipidemia. He continues lifestyle changes, including diet and exercise. He has a history of elevated Lp (a) and Apo B. He is currently managed on rosuvastatin  5 mg daily.   Past Medical History: Patient Active Problem List   Diagnosis Date Noted   GERD (gastroesophageal reflux disease) 05/22/2021   Hyperlipidemia 05/22/2021   Vitamin D  deficiency 05/22/2021   Generalized anxiety disorder 05/22/2021   Past Surgical History:  Procedure Laterality Date   INGUINAL HERNIA REPAIR Left    Family History  Problem Relation Age of Onset   Hypertension Father    Cancer Maternal Aunt        Lung   Cancer Maternal Aunt        Lung   Diabetes Paternal Uncle    Anxiety disorder Paternal Grandmother    Diabetes Paternal Grandmother    Colon polyps Paternal Grandmother    Heart disease Paternal Grandfather    Transient ischemic attack Paternal Grandfather    Pancreatic cancer Paternal Grandfather    Diabetes Paternal Uncle    Colon cancer Neg Hx    Esophageal cancer Neg Hx    Liver cancer Neg Hx    Liver disease Neg Hx    Outpatient Medications Prior to Visit  Medication Sig Dispense Refill   fexofenadine  (ALLEGRA ) 180 MG tablet Take by mouth.     fluticasone  (FLONASE )  50 MCG/ACT nasal spray Place 1 spray into both nostrils daily. 48 g 1   hydrOXYzine  (ATARAX ) 10 MG tablet Take 1 tablet (10 mg total) by mouth 3 (three) times daily as needed. 30 tablet 2   venlafaxine  XR (EFFEXOR  XR) 75 MG 24 hr capsule Take 1 capsule (75 mg total) by mouth daily with breakfast. 90 capsule 3   rosuvastatin  (CRESTOR ) 5 MG tablet Take 1 tablet (5 mg total) by mouth daily. 90 tablet 3   No facility-administered medications prior to visit.   No Known Allergies Objective:   Today's Vitals   08/10/23 1104  BP: 118/78  Pulse: 76  Temp: 98.6 F (37 C)  TempSrc: Temporal  SpO2: 99%  Weight: 197 lb 9.6 oz (89.6 kg)  Height: 6\' 1"  (1.854 m)   Body mass index is 26.07 kg/m.   General: Well developed, well nourished. No acute distress. Psych: Alert and oriented. Normal mood and affect.  Health Maintenance Due  Topic Date Due   HIV Screening  Never done   Lab Results Last lipids Lab Results  Component Value Date   CHOL 149 11/10/2022   HDL 46.50 11/10/2022   LDLCALC 75 11/10/2022   TRIG 141.0 11/10/2022   CHOLHDL 3 11/10/2022      Assessment & Plan:   Problem List Items Addressed This Visit  Other   Generalized anxiety disorder - Primary   Improved and in remission on medication. Continue counseling and venlafaxine  XR 75 mg daily.      Hyperlipidemia   Lipids at goal. I will continue rosuvastatin  5 mg daily.      Relevant Medications   rosuvastatin  (CRESTOR ) 5 MG tablet    Return in about 6 months (around 02/07/2024) for Annual preventative care.   Graig Lawyer, MD

## 2023-08-10 NOTE — Assessment & Plan Note (Signed)
Lipids at goal. I will continue rosuvastatin 5 mg daily.

## 2023-08-10 NOTE — Assessment & Plan Note (Signed)
Improved and in remission on medication. Continue counseling and venlafaxine XR 75 mg daily.

## 2023-09-06 ENCOUNTER — Other Ambulatory Visit: Payer: Self-pay

## 2023-11-07 ENCOUNTER — Other Ambulatory Visit: Payer: Self-pay

## 2023-12-02 ENCOUNTER — Other Ambulatory Visit (HOSPITAL_COMMUNITY): Payer: Self-pay

## 2023-12-05 ENCOUNTER — Other Ambulatory Visit: Payer: Self-pay

## 2023-12-05 ENCOUNTER — Other Ambulatory Visit (HOSPITAL_COMMUNITY): Payer: Self-pay

## 2023-12-06 ENCOUNTER — Other Ambulatory Visit: Payer: Self-pay

## 2024-02-01 ENCOUNTER — Other Ambulatory Visit: Payer: Self-pay | Admitting: Family Medicine

## 2024-02-01 ENCOUNTER — Other Ambulatory Visit: Payer: Self-pay

## 2024-02-01 MED ORDER — FLUTICASONE PROPIONATE 50 MCG/ACT NA SUSP
1.0000 | Freq: Every day | NASAL | 1 refills | Status: AC
  Filled 2024-02-01: qty 48, 90d supply, fill #0
  Filled 2024-05-02: qty 48, 90d supply, fill #1

## 2024-02-02 ENCOUNTER — Other Ambulatory Visit: Payer: Self-pay | Admitting: Medical Genetics

## 2024-02-07 ENCOUNTER — Other Ambulatory Visit (HOSPITAL_COMMUNITY): Payer: Self-pay

## 2024-02-08 ENCOUNTER — Ambulatory Visit: Payer: Self-pay | Admitting: Family Medicine

## 2024-02-08 ENCOUNTER — Ambulatory Visit (INDEPENDENT_AMBULATORY_CARE_PROVIDER_SITE_OTHER): Payer: 59 | Admitting: Family Medicine

## 2024-02-08 ENCOUNTER — Encounter: Payer: Self-pay | Admitting: Family Medicine

## 2024-02-08 VITALS — BP 114/70 | HR 72 | Temp 97.4°F | Ht 73.0 in | Wt 197.4 lb

## 2024-02-08 DIAGNOSIS — H6121 Impacted cerumen, right ear: Secondary | ICD-10-CM | POA: Diagnosis not present

## 2024-02-08 DIAGNOSIS — F411 Generalized anxiety disorder: Secondary | ICD-10-CM | POA: Diagnosis not present

## 2024-02-08 DIAGNOSIS — Z0000 Encounter for general adult medical examination without abnormal findings: Secondary | ICD-10-CM | POA: Diagnosis not present

## 2024-02-08 DIAGNOSIS — E782 Mixed hyperlipidemia: Secondary | ICD-10-CM | POA: Diagnosis not present

## 2024-02-08 DIAGNOSIS — E559 Vitamin D deficiency, unspecified: Secondary | ICD-10-CM

## 2024-02-08 LAB — HEPATIC FUNCTION PANEL
ALT: 33 U/L (ref 0–53)
AST: 24 U/L (ref 0–37)
Albumin: 4.8 g/dL (ref 3.5–5.2)
Alkaline Phosphatase: 46 U/L (ref 39–117)
Bilirubin, Direct: 0.1 mg/dL (ref 0.0–0.3)
Total Bilirubin: 0.6 mg/dL (ref 0.2–1.2)
Total Protein: 7.1 g/dL (ref 6.0–8.3)

## 2024-02-08 LAB — LIPID PANEL
Cholesterol: 152 mg/dL (ref 0–200)
HDL: 45.5 mg/dL (ref >39.00)
LDL Cholesterol: 89 mg/dL (ref 0–99)
NonHDL: 106.18
Total CHOL/HDL Ratio: 3
Triglycerides: 87 mg/dL (ref 0.0–149.0)
VLDL: 17.4 mg/dL (ref 0.0–40.0)

## 2024-02-08 LAB — VITAMIN D 25 HYDROXY (VIT D DEFICIENCY, FRACTURES): VITD: 30.89 ng/mL (ref 30.00–100.00)

## 2024-02-08 NOTE — Assessment & Plan Note (Signed)
 I will check a Vitamin D  level today.

## 2024-02-08 NOTE — Progress Notes (Signed)
 [facility] [address] [place] [address] Dept: [phone] Dept Fax: [phone]  Annual Physical Visit  Subjective:    Patient ID: David Best, male    DOB: 1990-08-11, 33 y.o..   MRN: [id number]  Chief Complaint  Patient presents with   Annual Exam    CPE/labs.  Fasting today.     History of Present Illness:  Patient is in today for an annual physical/preventative visit.  Mr. David Best has a history of generalized anxiety. He is managed on venlafaxine  XR 75 mg daily and hydroxyzine  10 mg TID PRN. Overall, he is doing well.   Mr. David Best has a history of hyperlipidemia. He continues lifestyle changes, including diet and exercise. He has a history of elevated Lp (a) and Apo B. He is currently managed on rosuvastatin  5 mg daily.   Review of Systems  Constitutional:  Negative for chills, diaphoresis, fever, malaise/fatigue and weight loss.  HENT:  Positive for hearing loss. Negative for congestion, ear pain, sinus pain, sore throat and tinnitus.        Notes wife wonders if he has some hearing loss.  Eyes:  Negative for blurred vision, pain, discharge and redness.  Respiratory:  Negative for cough, shortness of breath and wheezing.   Cardiovascular:  Negative for chest pain and palpitations.  Gastrointestinal:  Negative for abdominal pain, constipation, diarrhea, heartburn, nausea and vomiting.  Musculoskeletal:  Negative for back pain, joint pain and myalgias.  Skin:  Negative for itching and rash.  Psychiatric/Behavioral:  Negative for depression. The patient is nervous/anxious.        As noted above.   Past Medical History: Patient Active Problem List   Diagnosis Date Noted   GERD (gastroesophageal reflux disease) 05/22/2021   Hyperlipidemia 05/22/2021   Vitamin D  deficiency 05/22/2021   Generalized anxiety disorder 05/22/2021   Past Surgical History:  Procedure Laterality Date   INGUINAL  HERNIA REPAIR Left    Family History  Problem Relation Age of Onset   Hypertension Father    Cancer Maternal Aunt        Lung   Cancer Maternal Aunt        Lung   Diabetes Paternal Uncle    Anxiety disorder Paternal Grandmother    Diabetes Paternal Grandmother    Colon polyps Paternal Grandmother    Heart disease Paternal Grandfather    Transient ischemic attack Paternal Grandfather    Pancreatic cancer Paternal Grandfather    Diabetes Paternal Uncle    Colon cancer Neg Hx    Esophageal cancer Neg Hx    Liver cancer Neg Hx    Liver disease Neg Hx    Outpatient Medications Prior to Visit  Medication Sig Dispense Refill   fexofenadine  (ALLEGRA ) 180 MG tablet Take by mouth.     fluticasone  (FLONASE ) 50 MCG/ACT nasal spray Place 1 spray into both nostrils daily. 48 g 1   hydrOXYzine  (ATARAX ) 10 MG tablet Take 1 tablet (10 mg total) by mouth 3 (three) times daily as needed. 30 tablet 2   rosuvastatin  (CRESTOR ) 5 MG tablet Take 1 tablet (5 mg total) by mouth daily. 90 tablet 3   venlafaxine  XR (EFFEXOR  XR) 75 MG 24 hr capsule Take 1 capsule (75 mg total) by mouth daily with breakfast. 90 capsule 3   No facility-administered medications prior to visit.   No Known Allergies Objective:   Today's Vitals   02/08/24 0809  BP: 114/70  Pulse: 72  Temp: (!) 97.4 F (36.3 C)  TempSrc: Temporal  SpO2: 98%  Weight: 197 lb 6.4 oz (89.5 kg)  Height: 6' 1 (1.854 m)   Body mass index is 26.04 kg/m.   General: Well developed, well nourished. No acute distress. HEENT: Normocephalic, non-traumatic. PERRL, EOMI. Conjunctiva clear. External ears normal. Right EAC with   impacted wax. After removal, EAC and TMs normal bilaterally. Nose clear without congestion or rhinorrhea.   Mucous membranes moist. Oropharynx with mild mucous streaking. Good dentition. Neck: Supple. No lymphadenopathy. No thyromegaly. Lungs: Clear to auscultation bilaterally. No wheezing, rales or rhonchi. CV: RRR  without murmurs or rubs. Pulses 2+ bilaterally. Abdomen: Soft, non-tender. Bowel sounds positive, normal pitch and frequency. No hepatosplenomegaly. No   rebound or guarding. Extremities: Full ROM. No joint swelling or tenderness. No edema noted. Skin: Warm and dry. No rashes. Psych: Alert and oriented. Normal mood and affect.  Health Maintenance Due  Topic Date Due   HIV Screening  Never done   HPV VACCINES (1 - Risk 3-dose SCDM series) Never done     PROCEDURE- Ear Wax Removal Indication: Impacted ear wax  PARQ reviewed with patient. Verbal consent obtained. Lighted curette used to remove wax from right EAC. Patient tolerated procedure well.  Assessment & Plan:   Problem List Items Addressed This Visit       Other   Annual physical exam - Primary   Overall health is excellent. Recommend ongoing regular exercise. Discussed recommended screenings and immunizations.       Generalized anxiety disorder   In remission on medication. Continue counseling and venlafaxine  XR 75 mg daily.      Hyperlipidemia   I will check lipids today. Continue rosuvastatin  5 mg daily.      Relevant Orders   Lipid panel   Hepatic function panel   Vitamin D  deficiency   I will check a Vitamin D  level today.      Relevant Orders   VITAMIN D  25 Hydroxy (Vit-D Deficiency, Fractures)   Other Visit Diagnoses       Impacted cerumen of right ear       Removed as noted above.       Return in about 1 year (around 02/07/2025) for Annual preventative care.   Garnette CHRISTELLA Simpler, MD

## 2024-02-08 NOTE — Assessment & Plan Note (Signed)
 Overall health is excellent. Recommend ongoing regular exercise. Discussed recommended screenings and immunizations.

## 2024-02-08 NOTE — Assessment & Plan Note (Signed)
 In remission on medication. Continue counseling and venlafaxine  XR 75 mg daily.

## 2024-02-08 NOTE — Assessment & Plan Note (Signed)
I will check lipids today. Continue rosuvastatin 5 mg daily.

## 2024-02-22 ENCOUNTER — Other Ambulatory Visit (HOSPITAL_COMMUNITY)
Admission: RE | Admit: 2024-02-22 | Discharge: 2024-02-22 | Disposition: A | Discharge status: Home / Self Care | Payer: Self-pay | Source: Ambulatory Visit | Attending: Medical Genetics | Admitting: Medical Genetics

## 2024-02-27 ENCOUNTER — Other Ambulatory Visit: Payer: Self-pay | Admitting: Family Medicine

## 2024-02-27 ENCOUNTER — Other Ambulatory Visit (HOSPITAL_COMMUNITY): Payer: Self-pay

## 2024-02-27 ENCOUNTER — Encounter: Payer: Self-pay | Admitting: Family Medicine

## 2024-02-27 DIAGNOSIS — F411 Generalized anxiety disorder: Secondary | ICD-10-CM

## 2024-02-27 MED ORDER — VENLAFAXINE HCL ER 75 MG PO CP24
75.0000 mg | ORAL_CAPSULE | Freq: Every day | ORAL | 2 refills | Status: AC
  Filled 2024-02-27: qty 90, 90d supply, fill #0
  Filled 2024-05-29: qty 90, 90d supply, fill #1
  Filled 2024-08-27: qty 90, 90d supply, fill #2

## 2024-03-05 LAB — GENECONNECT MOLECULAR SCREEN: Genetic Analysis Overall Interpretation: NEGATIVE

## 2024-05-02 ENCOUNTER — Other Ambulatory Visit (HOSPITAL_COMMUNITY): Payer: Self-pay

## 2024-05-30 ENCOUNTER — Other Ambulatory Visit (HOSPITAL_COMMUNITY): Payer: Self-pay

## 2024-07-30 ENCOUNTER — Other Ambulatory Visit: Payer: Self-pay | Admitting: Family Medicine

## 2024-07-30 ENCOUNTER — Other Ambulatory Visit: Payer: Self-pay

## 2024-07-30 DIAGNOSIS — E782 Mixed hyperlipidemia: Secondary | ICD-10-CM

## 2024-07-30 MED ORDER — ROSUVASTATIN CALCIUM 5 MG PO TABS
5.0000 mg | ORAL_TABLET | Freq: Every day | ORAL | 3 refills | Status: AC
  Filled 2024-07-30: qty 90, 90d supply, fill #0

## 2024-08-27 ENCOUNTER — Other Ambulatory Visit: Payer: Self-pay

## 2024-08-27 ENCOUNTER — Encounter: Payer: Self-pay | Admitting: Family Medicine

## 2024-08-27 NOTE — Telephone Encounter (Signed)
 Added to pts immunization list

## 2025-02-13 ENCOUNTER — Encounter: Admitting: Family Medicine
